# Patient Record
Sex: Female | Born: 1975 | Hispanic: Yes
Health system: Western US, Academic
[De-identification: ages and names within clinical notes are randomized; demographics above are authoritative.]

## PROBLEM LIST (undated history)

## (undated) DIAGNOSIS — I10 Essential (primary) hypertension: Secondary | ICD-10-CM

## (undated) DIAGNOSIS — E119 Type 2 diabetes mellitus without complications: Secondary | ICD-10-CM

## (undated) HISTORY — PX: OTHER SURGICAL HISTORY: SHX169

## (undated) HISTORY — PX: WISDOM TOOTH EXTRACTION: SHX21

## (undated) HISTORY — PX: CHOLECYSTECTOMY: SHX55

## (undated) MED ORDER — GLIMEPIRIDE 1 MG OR TABS
ORAL_TABLET | ORAL | 0 refills | Status: AC
Start: 2018-09-14 — End: ?

---

## 2002-12-28 HISTORY — PX: KNEE ARTHROSCOPY W/ DEBRIDEMENT: SHX1867

## 2013-06-07 ENCOUNTER — Encounter (HOSPITAL_BASED_OUTPATIENT_CLINIC_OR_DEPARTMENT_OTHER): Payer: No Typology Code available for payment source | Admitting: Surgery

## 2013-08-31 ENCOUNTER — Emergency Department (HOSPITAL_COMMUNITY): Payer: 59

## 2013-08-31 ENCOUNTER — Encounter (HOSPITAL_COMMUNITY): Payer: Self-pay | Admitting: Emergency Medicine

## 2013-08-31 ENCOUNTER — Emergency Department (HOSPITAL_COMMUNITY)
Admission: EM | Admit: 2013-08-31 | Discharge: 2013-08-31 | Disposition: A | Discharge status: Home / Self Care | Payer: 59 | Attending: Emergency Medicine | Admitting: Emergency Medicine

## 2013-08-31 DIAGNOSIS — Z880 Allergy status to penicillin: Secondary | ICD-10-CM | POA: Insufficient documentation

## 2013-08-31 DIAGNOSIS — I10 Essential (primary) hypertension: Secondary | ICD-10-CM | POA: Insufficient documentation

## 2013-08-31 DIAGNOSIS — R1110 Vomiting, unspecified: Secondary | ICD-10-CM

## 2013-08-31 DIAGNOSIS — N390 Urinary tract infection, site not specified: Secondary | ICD-10-CM

## 2013-08-31 DIAGNOSIS — R112 Nausea with vomiting, unspecified: Secondary | ICD-10-CM | POA: Insufficient documentation

## 2013-08-31 DIAGNOSIS — E119 Type 2 diabetes mellitus without complications: Secondary | ICD-10-CM | POA: Insufficient documentation

## 2013-08-31 DIAGNOSIS — Z9884 Bariatric surgery status: Secondary | ICD-10-CM | POA: Insufficient documentation

## 2013-08-31 DIAGNOSIS — Z79899 Other long term (current) drug therapy: Secondary | ICD-10-CM | POA: Insufficient documentation

## 2013-08-31 HISTORY — DX: Type 2 diabetes mellitus without complications: E11.9

## 2013-08-31 HISTORY — DX: Essential (primary) hypertension: I10

## 2013-08-31 LAB — URINALYSIS, ROUTINE W REFLEX MICROSCOPIC
Glucose, UA: NEGATIVE mg/dL
pH: 6 (ref 5.0–8.0)

## 2013-08-31 LAB — CBC WITH DIFFERENTIAL/PLATELET
Basophils Absolute: 0 10*3/uL (ref 0.0–0.1)
HCT: 38.2 % (ref 36.0–46.0)
Hemoglobin: 12.8 g/dL (ref 12.0–15.0)
Lymphocytes Relative: 20 % (ref 12–46)
Lymphs Abs: 1.8 10*3/uL (ref 0.7–4.0)
Monocytes Absolute: 0.5 10*3/uL (ref 0.1–1.0)
Monocytes Relative: 6 % (ref 3–12)
Neutro Abs: 6.4 10*3/uL (ref 1.7–7.7)
RBC: 4.56 MIL/uL (ref 3.87–5.11)
WBC: 8.8 10*3/uL (ref 4.0–10.5)

## 2013-08-31 LAB — HEPATIC FUNCTION PANEL
ALT: 14 U/L (ref 0–35)
Alkaline Phosphatase: 82 U/L (ref 39–117)
Indirect Bilirubin: 0.7 mg/dL (ref 0.3–0.9)
Total Protein: 7.6 g/dL (ref 6.0–8.3)

## 2013-08-31 LAB — BASIC METABOLIC PANEL
BUN: 6 mg/dL (ref 6–23)
CO2: 27 mEq/L (ref 19–32)
Chloride: 98 mEq/L (ref 96–112)
Creatinine, Ser: 0.57 mg/dL (ref 0.50–1.10)

## 2013-08-31 LAB — URINE MICROSCOPIC-ADD ON

## 2013-08-31 MED ORDER — CIPROFLOXACIN IN D5W 400 MG/200ML IV SOLN
400.0000 mg | Freq: Once | INTRAVENOUS | Status: AC
  Administered 2013-08-31: 400 mg via INTRAVENOUS
  Filled 2013-08-31: qty 200

## 2013-08-31 MED ORDER — PANTOPRAZOLE SODIUM 40 MG IV SOLR
40.0000 mg | Freq: Once | INTRAVENOUS | Status: AC
  Administered 2013-08-31: 40 mg via INTRAVENOUS
  Filled 2013-08-31: qty 40

## 2013-08-31 MED ORDER — SODIUM CHLORIDE 0.9 % IV BOLUS (SEPSIS)
1000.0000 mL | Freq: Once | INTRAVENOUS | Status: AC
  Administered 2013-08-31: 1000 mL via INTRAVENOUS

## 2013-08-31 MED ORDER — ONDANSETRON 4 MG PO TBDP: ORAL_TABLET | ORAL | Status: DC

## 2013-08-31 MED ORDER — ONDANSETRON HCL 4 MG/2ML IJ SOLN
4.0000 mg | Freq: Once | INTRAMUSCULAR | Status: AC
  Administered 2013-08-31: 4 mg via INTRAVENOUS
  Filled 2013-08-31: qty 2

## 2013-08-31 MED ORDER — CIPROFLOXACIN HCL 500 MG PO TABS: 500.0000 mg | ORAL_TABLET | Freq: Two times a day (BID) | ORAL | Status: DC

## 2013-08-31 NOTE — ED Notes (Signed)
Pt has been having n/v abd pain x 1 week now, lmp Aug 1st week ,

## 2013-08-31 NOTE — ED Provider Notes (Signed)
CSN: [id number]     Arrival date & time 08/31/13  1317 History   First MD Initiated Contact with Patient 08/31/13 1331     Chief Complaint  Patient presents with  . Nausea  . Emesis  . Abdominal Pain   (Consider location/radiation/quality/duration/timing/severity/associated sxs/prior Treatment) Patient is a 37 y.o. female presenting with vomiting and abdominal pain. The history is provided by the patient (the pt complains of vomiting for a couple days).  Emesis Severity:  Mild Timing:  Intermittent Quality:  Undigested food Able to tolerate:  Liquids Progression:  Unchanged Associated symptoms: no diarrhea and no headaches   Abdominal Pain Associated symptoms: vomiting   Associated symptoms: no chest pain, no cough, no diarrhea, no fatigue and no hematuria     Past Medical History  Diagnosis Date  . Diabetes mellitus without complication   . Hypertension    Past Surgical History  Procedure Laterality Date  . Lapband     No family history on file. History  Substance Use Topics  . Smoking status: Not on file  . Smokeless tobacco: Not on file  . Alcohol Use: Not on file   OB History   Grav Para Term Preterm Abortions TAB SAB Ect Mult Living                 Review of Systems  Constitutional: Negative for appetite change and fatigue.  HENT: Negative for congestion, sinus pressure and ear discharge.   Eyes: Negative for discharge.  Respiratory: Negative for cough.   Cardiovascular: Negative for chest pain.  Gastrointestinal: Positive for vomiting. Negative for diarrhea.  Genitourinary: Negative for frequency and hematuria.  Musculoskeletal: Negative for back pain.  Skin: Negative for rash.  Neurological: Negative for seizures and headaches.  Psychiatric/Behavioral: Negative for hallucinations.    Allergies  Aleve; Amoxicillin; Cephalosporins; Nsaids; and Penicillins  Home Medications   Current Outpatient Rx  Name  Route  Sig  Dispense  Refill  . acetaminophen  (TYLENOL) 500 MG tablet   Oral   Take 500 mg by mouth every 6 (six) hours as needed for pain.         Marland Kitchen albuterol (PROVENTIL HFA;VENTOLIN HFA) 108 (90 BASE) MCG/ACT inhaler   Inhalation   Inhale 2 puffs into the lungs every 6 (six) hours as needed for wheezing.         Marland Kitchen ALPRAZolam (XANAX) 1 MG tablet   Oral   Take 1 mg by mouth 3 (three) times daily as needed for sleep or anxiety.         . bismuth subsalicylate (PEPTO BISMOL) 262 MG/15ML suspension   Oral   Take 15 mLs by mouth every 6 (six) hours as needed for indigestion.         Marland Kitchen EPINEPHrine (EPIPEN) 0.3 mg/0.3 mL SOAJ injection   Intramuscular   Inject 0.3 mg into the muscle once.         Marland Kitchen glimepiride (AMARYL) 4 MG tablet   Oral   Take 8 mg by mouth daily before breakfast.         . HYDROcodone-acetaminophen (NORCO/VICODIN) 5-325 MG per tablet   Oral   Take 1 tablet by mouth every 6 (six) hours as needed for pain.         Marland Kitchen ondansetron (ZOFRAN) 4 MG tablet   Oral   Take 4 mg by mouth every 8 (eight) hours as needed for nausea.         Marland Kitchen oxyCODONE (OXYCONTIN) 10 MG 12 hr  tablet   Oral   Take 10 mg by mouth every 12 (twelve) hours.         . propranolol (INDERAL) 20 MG tablet   Oral   Take 20 mg by mouth 2 (two) times daily.         . ciprofloxacin (CIPRO) 500 MG tablet   Oral   Take 1 tablet (500 mg total) by mouth 2 (two) times daily. One po bid x 7 days   14 tablet   0   . ondansetron (ZOFRAN ODT) 4 MG disintegrating tablet      4mg  ODT q6 hours prn nausea/vomit   12 tablet   0    BP 141/81  Pulse 82  Temp(Src) 98 F (36.7 C) (Oral)  Resp 20  SpO2 100%  LMP 07/28/2013 Physical Exam  Constitutional: She is oriented to person, place, and time. She appears well-developed.  HENT:  Head: Normocephalic.  Eyes: Conjunctivae and EOM are normal. No scleral icterus.  Neck: Neck supple. No thyromegaly present.  Cardiovascular: Normal rate and regular rhythm.  Exam reveals no gallop  and no friction rub.   No murmur heard. Pulmonary/Chest: No stridor. She has no wheezes. She has no rales. She exhibits no tenderness.  Abdominal: She exhibits no distension. There is no tenderness. There is no rebound.  Musculoskeletal: Normal range of motion. She exhibits no edema.  Lymphadenopathy:    She has no cervical adenopathy.  Neurological: She is oriented to person, place, and time. Coordination normal.  Skin: No rash noted. No erythema.  Psychiatric: She has a normal mood and affect. Her behavior is normal.    ED Course  Procedures (including critical care time) Labs Review Labs Reviewed  URINALYSIS, ROUTINE W REFLEX MICROSCOPIC - Abnormal; Notable for the following:    APPearance CLOUDY (*)    Leukocytes, UA LARGE (*)    All other components within normal limits  BASIC METABOLIC PANEL - Abnormal; Notable for the following:    Sodium 134 (*)    Glucose, Bld 190 (*)    All other components within normal limits  HEPATIC FUNCTION PANEL - Abnormal; Notable for the following:    Albumin 3.3 (*)    All other components within normal limits  GLUCOSE, CAPILLARY - Abnormal; Notable for the following:    Glucose-Capillary 151 (*)    All other components within normal limits  URINE MICROSCOPIC-ADD ON - Abnormal; Notable for the following:    Bacteria, UA MANY (*)    All other components within normal limits  URINE CULTURE  CBC WITH DIFFERENTIAL   Imaging Review Dg Abd Acute W/chest  08/31/2013   *RADIOLOGY REPORT*  Clinical Data: Upper abdominal pain, nausea and vomiting  ACUTE ABDOMEN SERIES (ABDOMEN 2 VIEW & CHEST 1 VIEW)  Comparison: None.  Findings: Slightly low inspiratory volumes with left basilar atelectasis.  Mild prominence of the left heart is likely within normal limits given frontal technique and of low inspiratory volume.  No focal airspace consolidation, pulmonary edema, pleural effusion or pneumothorax.  No free air under the diaphragm.  Lap band with abnormal Phi  angle of 80 degrees.  The subcutaneous reservoir projects over the right abdomen.  The connecting tubing appears intact.  The bowel gas pattern is not obstructed.  Surgical clips in the right upper quadrant suggest prior cholecystectomy. No acute osseous abnormality.  IMPRESSION:  1.  Abnormal Phi angle of 80 degrees suggests slipped lap band. Correlation with prior imaging if available would be helpful.  Consider surgical consultation if clinically warranted. 2.  The bowel gas pattern is not obstructed. 3.  Slightly low inspiratory volumes with left basilar atelectasis.   Original Report Authenticated By: Malachy Moan, M.D.    MDM  The pt has a uti and will follow up as out pt.  She states she knows her lap band may need revision and she has spoke to her surgeon about this and has decided not to do the revision now. 1. UTI (lower urinary tract infection)   2. Vomiting        Benny Lennert, MD 08/31/13 [id number]

## 2013-08-31 NOTE — Progress Notes (Signed)
Pt noted without pcp States she is visiting from [place] and she is seen by Dr Micah Flesher EPIC updated

## 2013-09-01 LAB — URINE CULTURE

## 2014-09-09 IMAGING — CR DG ABDOMEN ACUTE W/ 1V CHEST
3 series · 3 of 3 positions shown · non-contrast
Comparison: None.

CLINICAL DATA: Upper abdominal pain, nausea and vomiting

ACUTE ABDOMEN SERIES (ABDOMEN 2 VIEW & CHEST 1 VIEW)

[w chest pa]
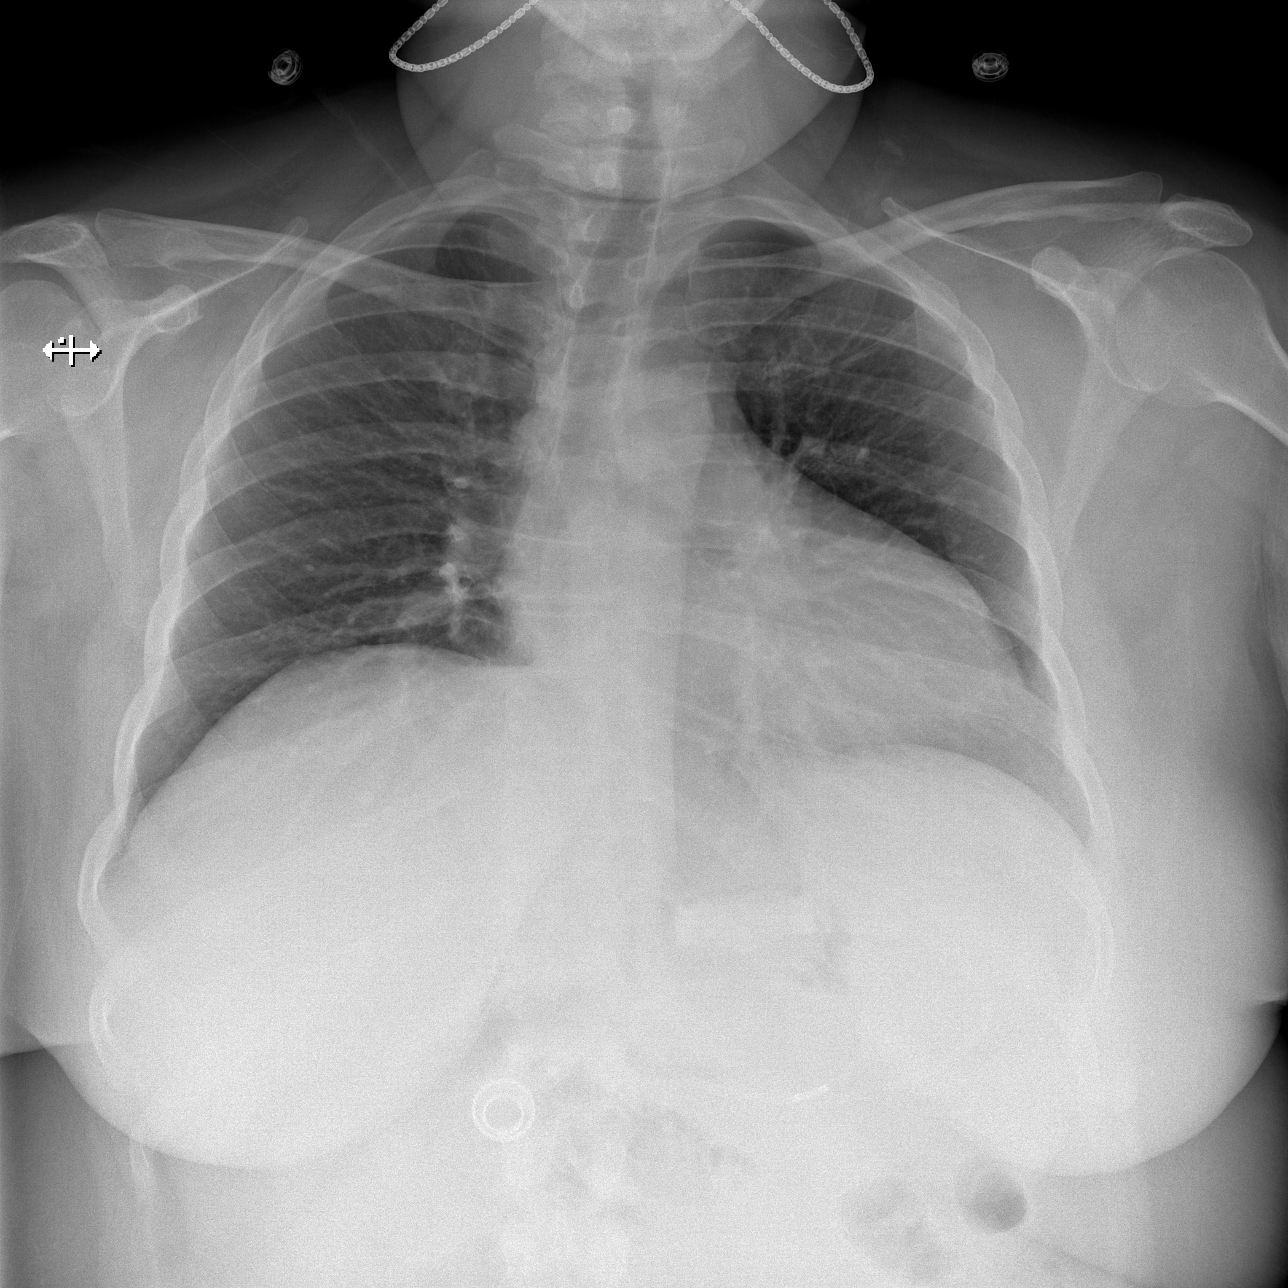

[w abdomen upright]
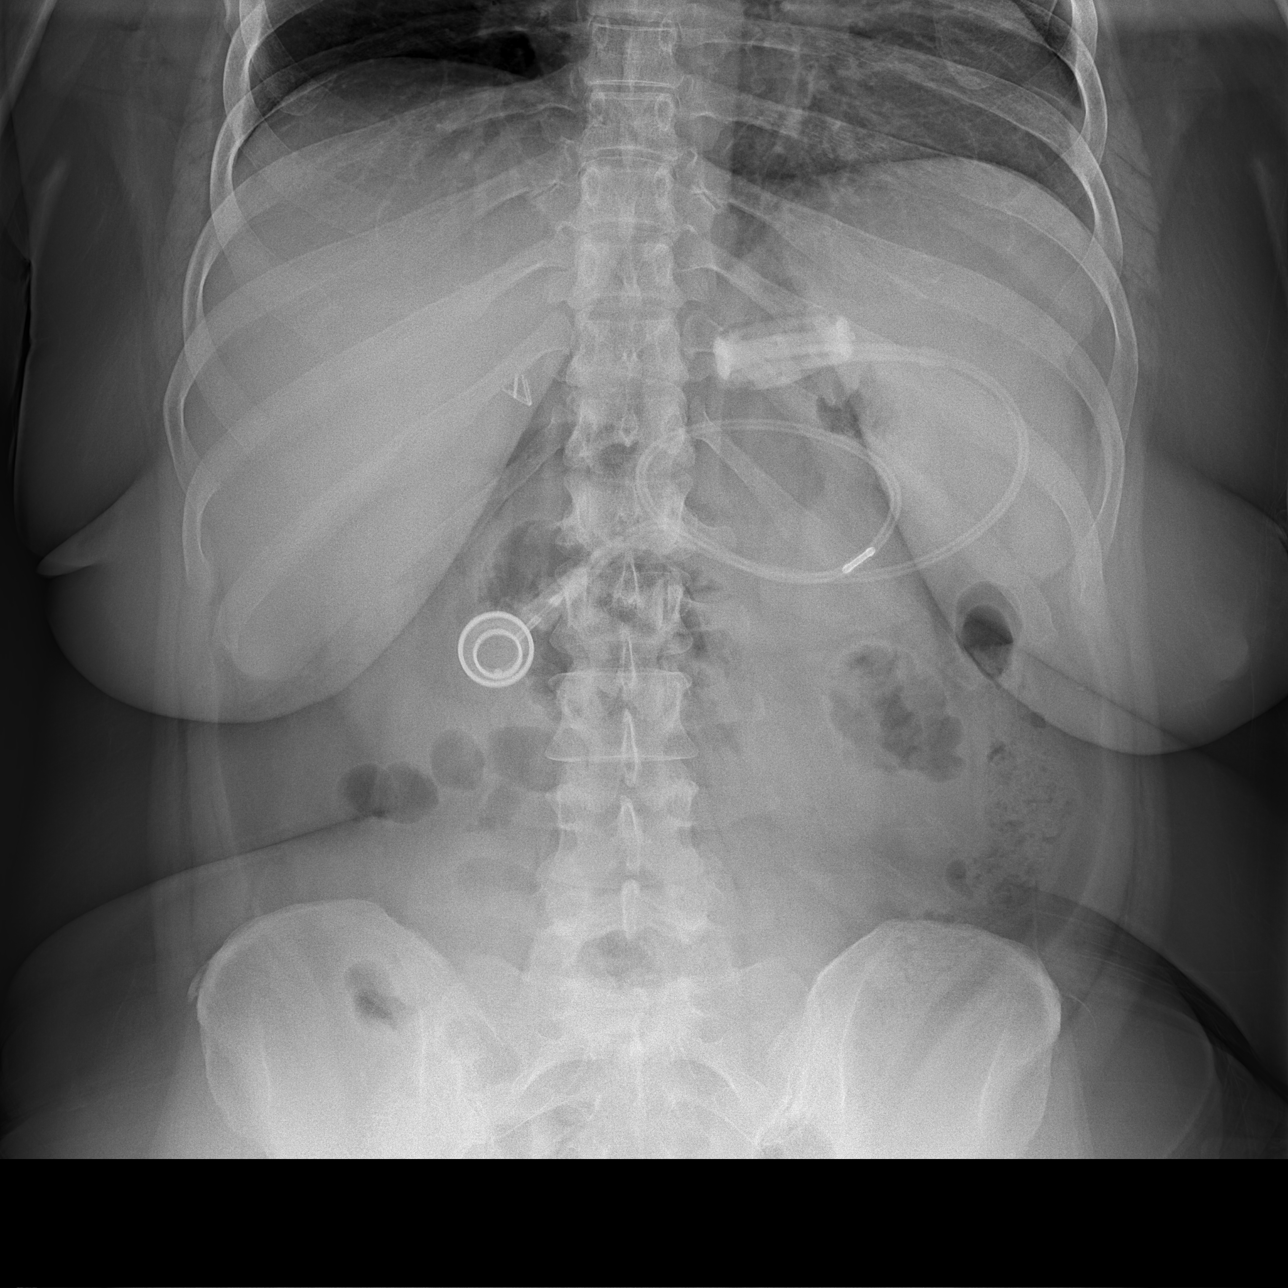

[t abdomen supine]
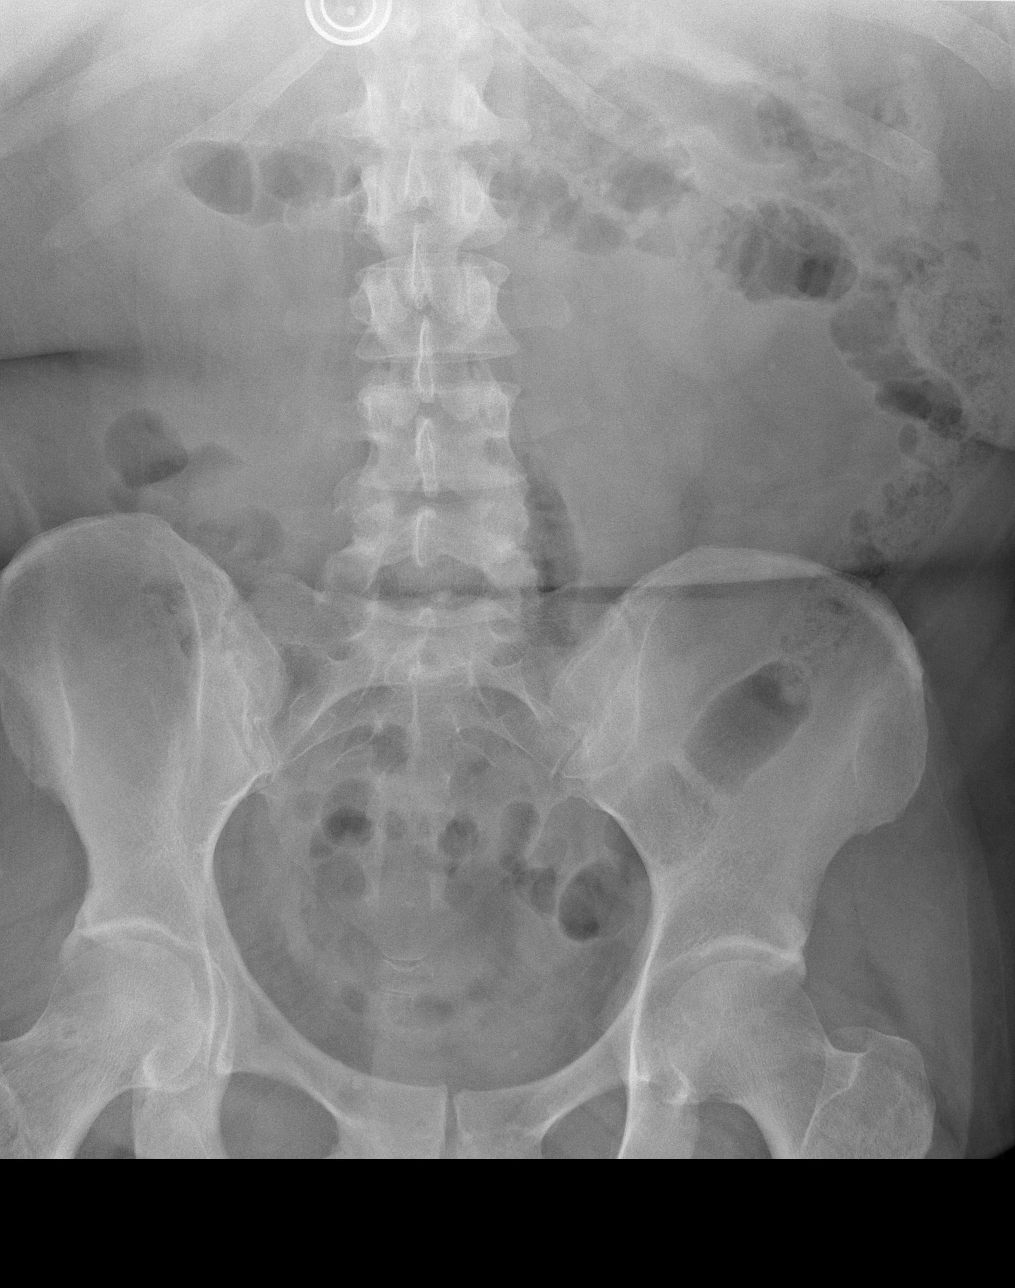

[3 of 3 positions shown; findings below may reference images not displayed]

FINDINGS: Slightly low inspiratory volumes with left basilar
atelectasis.  Mild prominence of the left heart is likely within
normal limits given frontal technique and of low inspiratory
volume.  No focal airspace consolidation, pulmonary edema, pleural
effusion or pneumothorax.  No free air under the diaphragm.

Lap band with abnormal Phi angle of 80 degrees.  The subcutaneous
reservoir projects over the right abdomen.  The connecting tubing
appears intact.  The bowel gas pattern is not obstructed.  Surgical
clips in the right upper quadrant suggest prior cholecystectomy.
No acute osseous abnormality.
IMPRESSION: 1.  Abnormal Phi angle of 80 degrees suggests slipped lap band.
Correlation with prior imaging if available would be helpful.
Consider surgical consultation if clinically warranted.
2.  The bowel gas pattern is not obstructed.
3.  Slightly low inspiratory volumes with left basilar atelectasis.

## 2016-04-23 ENCOUNTER — Emergency Department (HOSPITAL_COMMUNITY)
Admission: EM | Admit: 2016-04-23 | Discharge: 2016-04-23 | Disposition: A | Discharge status: Home / Self Care | Payer: Medicaid - Out of State | Attending: Emergency Medicine | Admitting: Emergency Medicine

## 2016-04-23 ENCOUNTER — Encounter (HOSPITAL_COMMUNITY): Payer: Self-pay

## 2016-04-23 DIAGNOSIS — Z760 Encounter for issue of repeat prescription: Secondary | ICD-10-CM

## 2016-04-23 DIAGNOSIS — Y9289 Other specified places as the place of occurrence of the external cause: Secondary | ICD-10-CM | POA: Insufficient documentation

## 2016-04-23 DIAGNOSIS — E119 Type 2 diabetes mellitus without complications: Secondary | ICD-10-CM | POA: Insufficient documentation

## 2016-04-23 DIAGNOSIS — E669 Obesity, unspecified: Secondary | ICD-10-CM | POA: Insufficient documentation

## 2016-04-23 DIAGNOSIS — Y9389 Activity, other specified: Secondary | ICD-10-CM | POA: Insufficient documentation

## 2016-04-23 DIAGNOSIS — Z880 Allergy status to penicillin: Secondary | ICD-10-CM | POA: Insufficient documentation

## 2016-04-23 DIAGNOSIS — Z79899 Other long term (current) drug therapy: Secondary | ICD-10-CM | POA: Insufficient documentation

## 2016-04-23 DIAGNOSIS — I10 Essential (primary) hypertension: Secondary | ICD-10-CM | POA: Insufficient documentation

## 2016-04-23 DIAGNOSIS — H6123 Impacted cerumen, bilateral: Secondary | ICD-10-CM | POA: Insufficient documentation

## 2016-04-23 DIAGNOSIS — Y998 Other external cause status: Secondary | ICD-10-CM | POA: Insufficient documentation

## 2016-04-23 DIAGNOSIS — T7840XA Allergy, unspecified, initial encounter: Secondary | ICD-10-CM

## 2016-04-23 DIAGNOSIS — X58XXXA Exposure to other specified factors, initial encounter: Secondary | ICD-10-CM | POA: Insufficient documentation

## 2016-04-23 MED ORDER — FLUTICASONE PROPIONATE 50 MCG/ACT NA SUSP: 2.0000 | Freq: Every day | NASAL | Status: AC

## 2016-04-23 MED ORDER — GLIMEPIRIDE 4 MG PO TABS: 8.0000 mg | ORAL_TABLET | Freq: Every day | ORAL | Status: AC

## 2016-04-23 NOTE — ED Notes (Signed)
PA at bedside.

## 2016-04-23 NOTE — ED Provider Notes (Signed)
CSN: [id number]     Arrival date & time 04/23/16  0551 History   First MD Initiated Contact with Patient 04/23/16 0600     No chief complaint on file.    (Consider location/radiation/quality/duration/timing/severity/associated sxs/prior Treatment) HPI   40 year old female with history of non-insulin-dependent dependent diabetes, hypertension presenting with multiple complaints. First complaint is that patient is running out of her normal diabetic medications specifically McBride. She takes 8 mg daily for the past month she has been taking half of those doses to try to preserve medication. She felt that her diabetes is not being well controlled due to not being compliant with her medication. She endorse some aspects of polyuria and polydipsia and generalized fatigue. Furthermore, 3 weeks ago patient developed URI symptoms including congestion and sneezing coughing and postnasal drip and not feeling well. She was seen at an urgent care and was prescribed Z-Pak which she finished the course but it did not provide any significant relief. She still endorses sinus congestion and occasional cough, occasional abdominal discomfort with several bouts of vomiting that is nonbloody and nonbilious and several loose stools. She also endorse mild generalized body aches.  Yesterday she ate some eggs and feel that she may have developed an allergic reaction to it which includes abdominal discomfort and and diarrhea. She has been taking the counter medications include benadryl to help with symptom with adequate relief. [place] in [place] and she is currently here for work. She is missing work due to not feeling well and requesting for work note. Otherwise patient denies having any significant fever, neck stiffness, shortness of breath, back pain, dysuria, hematuria, vaginal bleeding or vaginal discharge.  Past Medical History  Diagnosis Date  . Diabetes mellitus without complication   .  Hypertension    Past Surgical History  Procedure Laterality Date  . Lapband     No family history on file. Social History  Substance Use Topics  . Smoking status: Not on file  . Smokeless tobacco: Not on file  . Alcohol Use: Not on file   OB History    No data available     Review of Systems  All other systems reviewed and are negative.     Allergies  Aleve; Amoxicillin; Cephalosporins; Nsaids; and Penicillins  Home Medications   Prior to Admission medications   Medication Sig Start Date End Date Taking? Authorizing Provider  acetaminophen (TYLENOL) 500 MG tablet Take 500 mg by mouth every 6 (six) hours as needed for pain.    Historical Provider, MD  albuterol (PROVENTIL HFA;VENTOLIN HFA) 108 (90 BASE) MCG/ACT inhaler Inhale 2 puffs into the lungs every 6 (six) hours as needed for wheezing.    Historical Provider, MD  ALPRAZolam Prudy Feeler(XANAX) 1 MG tablet Take 1 mg by mouth 3 (three) times daily as needed for sleep or anxiety.    Historical Provider, MD  bismuth subsalicylate (PEPTO BISMOL) 262 MG/15ML suspension Take 15 mLs by mouth every 6 (six) hours as needed for indigestion.    Historical Provider, MD  ciprofloxacin (CIPRO) 500 MG tablet Take 1 tablet (500 mg total) by mouth 2 (two) times daily. One po bid x 7 days 08/31/13   Bethann BerkshireJoseph Zammit, MD  EPINEPHrine (EPIPEN) 0.3 mg/0.3 mL SOAJ injection Inject 0.3 mg into the muscle once.    Historical Provider, MD  glimepiride (AMARYL) 4 MG tablet Take 8 mg by mouth daily before breakfast.    Historical Provider, MD  HYDROcodone-acetaminophen (NORCO/VICODIN) 5-325 MG per tablet Take  1 tablet by mouth every 6 (six) hours as needed for pain.    Historical Provider, MD  ondansetron (ZOFRAN ODT) 4 MG disintegrating tablet  ODT q6 hours prn nausea/vomit 08/31/13   Bethann Berkshire, MD  ondansetron (ZOFRAN) 4 MG tablet Take 4 mg by mouth every 8 (eight) hours as needed for nausea.    Historical Provider, MD  oxyCODONE (OXYCONTIN) 10 MG 12 hr  tablet Take 10 mg by mouth every 12 (twelve) hours.    Historical Provider, MD  propranolol (INDERAL) 20 MG tablet Take 20 mg by mouth 2 (two) times daily.    Historical Provider, MD   BP 139/96 mmHg  Pulse 108  Temp(Src) 98 F (36.7 C) (Oral)  Resp 22  Ht  (1.6 m)  SpO2 98% Physical Exam  Constitutional: She is oriented to person, place, and time. She appears well-developed and well-nourished. No distress.  Obese Caucasian female sitting in bed in no acute discomfort and nontoxic.  HENT:  Head: Atraumatic.  Ears: Cerumen impaction to both ears obstructing the TMs Nose: Mild boggy nasal turbinates Throat: Uvula is midline no tonsillar enlargement or exudates, no trismus  Eyes: Conjunctivae are normal.  Neck: Neck supple.  No nuchal rigidity  Cardiovascular: Normal rate and regular rhythm.   Pulmonary/Chest: Effort normal and breath sounds normal. No respiratory distress. She has no wheezes. She has no rales. She exhibits no tenderness.  Abdominal: Soft. There is no tenderness.  Neurological: She is alert and oriented to person, place, and time.  Skin: No rash noted.  Psychiatric: She has a normal mood and affect.  Nursing note and vitals reviewed.   ED Course  Procedures (including critical care time)   MDM   Final diagnoses:  Encounter for medication refill  Allergy, initial encounter    BP 139/96 mmHg  Pulse 108  Temp(Src) 98 F (36.7 C) (Oral)  Resp 22  Ht  (1.6 m)  SpO2 98%  LMP 04/01/2016 (Approximate)   6:26 AM Patient presents with congestion, and some allergy symptoms since moving down to [place] for work from [place].  On exam I don't appreciate any significant signs of bacterial infection. The lungs are clear and she has no shortness of breath without any fever. Plan to prescribe decongestions, and Flonase for symptomatic relief. She is also out of her diabetic medication which I'm happy to refill. Work note provided. Otherwise no acute emergent  medical condition concerning which may require further testing currently. Return precaution discussed.    Fayrene Helper, PA-C 04/23/16 0636  Layla Maw Ward, DO 04/23/16 1610

## 2016-04-23 NOTE — ED Notes (Signed)
Per pt, complaining of headache, coughing, congestion. Pt states that this has been going since Sunday. Pt is out of glimipride, diabetic medication. She has been out "for a couple of months".

## 2016-04-23 NOTE — Discharge Instructions (Signed)
Please take your diabetic medication as prescribed.  Use flonase to help with congestion and sinus symptoms.  Follow up with your doctor for further care.   Medicine Refill at the Emergency Department We have refilled your medicine today, but it is best for you to get refills through your primary health care provider's office. In the future, please plan ahead so you do not need to get refills from the emergency department. If the medicine we refilled was a maintenance medicine, you may have received only enough to get you by until you are able to see your regular health care provider.   This information is not intended to replace advice given to you by your health care provider. Make sure you discuss any questions you have with your health care provider.   Document Released: 04/01/2004 Document Revised: 01/04/2015 Document Reviewed: 03/23/2014 Elsevier Interactive Patient Education 2016 [employer]  Allergies An allergy is when your body reacts to a substance in a way that is not normal. An allergic reaction can happen after you:  Eat something.  Breathe in something.  Touch something. WHAT KINDS OF ALLERGIES ARE THERE? You can be allergic to:  Things that are only around during certain seasons, like molds and pollens.  Foods.  Drugs.  Insects.  Animal dander. WHAT ARE SYMPTOMS OF ALLERGIES?  Puffiness (swelling). This may happen on the lips, face, tongue, mouth, or throat.  Sneezing.  Coughing.  Breathing loudly (wheezing).  Stuffy nose.  Tingling in the mouth.  A rash.  Itching.  Itchy, red, puffy areas of skin (hives).  Watery eyes.  Throwing up (vomiting).  Watery poop (diarrhea).  Dizziness.  Feeling faint or fainting.  Trouble breathing or swallowing.  A tight feeling in the chest.  A fast heartbeat. HOW ARE ALLERGIES DIAGNOSED? Allergies can be diagnosed with:  A medical and family history.  Skin tests.  Blood tests.  A food diary.  A food diary is a record of all the foods, drinks, and symptoms you have each day.  The results of an elimination diet. This diet involves making sure not to eat certain foods and then seeing what happens when you start eating them again. HOW ARE ALLERGIES TREATED? There is no cure for allergies, but allergic reactions can be treated with medicine. Severe reactions usually need to be treated at a hospital.  HOW CAN REACTIONS BE PREVENTED? The best way to prevent an allergic reaction is to avoid the thing you are allergic to. Allergy shots and medicines can also help prevent reactions in some cases.   This information is not intended to replace advice given to you by your health care provider. Make sure you discuss any questions you have with your health care provider.   Document Released: 04/10/2013 Document Revised: 01/04/2015 Document Reviewed: 09/25/2014 Elsevier Interactive Patient Education [employer]2016 Elsevier Inc.

## 2016-05-06 ENCOUNTER — Encounter (HOSPITAL_COMMUNITY): Payer: Self-pay | Admitting: *Deleted

## 2016-05-06 ENCOUNTER — Emergency Department (HOSPITAL_COMMUNITY)
Admission: EM | Admit: 2016-05-06 | Discharge: 2016-05-06 | Disposition: A | Discharge status: Home / Self Care | Payer: Medicaid - Out of State | Attending: Emergency Medicine | Admitting: Emergency Medicine

## 2016-05-06 DIAGNOSIS — Z79899 Other long term (current) drug therapy: Secondary | ICD-10-CM | POA: Insufficient documentation

## 2016-05-06 DIAGNOSIS — Z880 Allergy status to penicillin: Secondary | ICD-10-CM | POA: Insufficient documentation

## 2016-05-06 DIAGNOSIS — I10 Essential (primary) hypertension: Secondary | ICD-10-CM | POA: Insufficient documentation

## 2016-05-06 DIAGNOSIS — R739 Hyperglycemia, unspecified: Secondary | ICD-10-CM

## 2016-05-06 DIAGNOSIS — R51 Headache: Secondary | ICD-10-CM | POA: Insufficient documentation

## 2016-05-06 DIAGNOSIS — Z7951 Long term (current) use of inhaled steroids: Secondary | ICD-10-CM | POA: Insufficient documentation

## 2016-05-06 DIAGNOSIS — K591 Functional diarrhea: Secondary | ICD-10-CM

## 2016-05-06 DIAGNOSIS — Z7984 Long term (current) use of oral hypoglycemic drugs: Secondary | ICD-10-CM | POA: Insufficient documentation

## 2016-05-06 DIAGNOSIS — E1165 Type 2 diabetes mellitus with hyperglycemia: Secondary | ICD-10-CM | POA: Insufficient documentation

## 2016-05-06 DIAGNOSIS — Z3202 Encounter for pregnancy test, result negative: Secondary | ICD-10-CM | POA: Insufficient documentation

## 2016-05-06 DIAGNOSIS — Z87891 Personal history of nicotine dependence: Secondary | ICD-10-CM | POA: Insufficient documentation

## 2016-05-06 LAB — I-STAT CHEM 8, ED
BUN: 9 mg/dL (ref 6–20)
CALCIUM ION: 1.06 mmol/L — AB (ref 1.12–1.23)
CREATININE: 0.6 mg/dL (ref 0.44–1.00)
Chloride: 98 mmol/L — ABNORMAL LOW (ref 101–111)
Glucose, Bld: 342 mg/dL — ABNORMAL HIGH (ref 65–99)
HEMATOCRIT: 46 % (ref 36.0–46.0)
HEMOGLOBIN: 15.6 g/dL — AB (ref 12.0–15.0)
Potassium: 3.9 mmol/L (ref 3.5–5.1)
Sodium: 135 mmol/L (ref 135–145)
TCO2: 23 mmol/L (ref 0–100)

## 2016-05-06 LAB — URINALYSIS, ROUTINE W REFLEX MICROSCOPIC
BILIRUBIN URINE: NEGATIVE
Hgb urine dipstick: NEGATIVE
KETONES UR: 40 mg/dL — AB
LEUKOCYTES UA: NEGATIVE
NITRITE: NEGATIVE
PROTEIN: NEGATIVE mg/dL
Specific Gravity, Urine: >1.046 — ABNORMAL HIGH (ref 1.005–1.030)
pH: 5.5 (ref 5.0–8.0)

## 2016-05-06 LAB — URINE MICROSCOPIC-ADD ON: RBC / HPF: NONE SEEN RBC/hpf (ref 0–5)

## 2016-05-06 LAB — POC URINE PREG, ED: Preg Test, Ur: NEGATIVE

## 2016-05-06 LAB — CBG MONITORING, ED: GLUCOSE-CAPILLARY: 359 mg/dL — AB (ref 65–99)

## 2016-05-06 MED ORDER — ONDANSETRON 8 MG PO TBDP: ORAL_TABLET | ORAL | Status: AC

## 2016-05-06 MED ORDER — ONDANSETRON 4 MG PO TBDP
8.0000 mg | ORAL_TABLET | Freq: Once | ORAL | Status: AC
  Administered 2016-05-06: 8 mg via ORAL
  Filled 2016-05-06: qty 2

## 2016-05-06 NOTE — Discharge Instructions (Signed)

## 2016-05-06 NOTE — ED Provider Notes (Signed)
CSN: [id number]     Arrival date & time 05/06/16  0602 History   First MD Initiated Contact with Patient 05/06/16 [phone]     Chief Complaint  Patient presents with  . Emesis    Patient is a 40 y.o. female presenting with diarrhea. The history is provided by the patient.  Diarrhea Quality:  Watery Severity:  Moderate Onset quality:  Gradual Number of episodes:  Multiple Duration:  24 hours Timing:  Intermittent Progression:  Worsening Relieved by:  Anti-motility medications Worsened by:  Nothing tried Associated symptoms: abdominal pain and headaches   Associated symptoms: no chills, no recent cough, no fever and no vomiting   Risk factors: recent antibiotic use   Risk factors: no travel to endemic areas   Risk factors comment:  Zpack last month PT reports nausea with "spitting up" but denies vomiting and also multiple episodes of nonbloody diarrhea for past 24 hours She also reports abd pain - she points to LUQ as source of pain No fever She also reports intermittent HA for past several months since moving to the [facility] She reports "itching" but now just found out she has bed bugs No vag bleeding/discharge No dysuria She is s/p lap band from 2008   Past Medical History  Diagnosis Date  . Diabetes mellitus without complication (HCC)   . Hypertension    Past Surgical History  Procedure Laterality Date  . Lapband    . Cholecystectomy    . Knee arthroscopy w/ debridement Left 2004  . Wisdom tooth extraction Bilateral     1993   No family history on file. Social History  Substance Use Topics  . Smoking status: Former Smoker    Types: Cigarettes    Quit date: 03/29/2007  . Smokeless tobacco: Never Used  . Alcohol Use: Yes     Comment: occassionally   OB History    No data available     Review of Systems  Constitutional: Negative for fever and chills.  Gastrointestinal: Positive for abdominal pain and diarrhea. Negative for vomiting and blood in stool.   Genitourinary: Negative for dysuria, vaginal bleeding and vaginal discharge.  Neurological: Positive for headaches.  All other systems reviewed and are negative.     Allergies  Aleve; Amoxicillin; Cephalosporins; Nsaids; and Penicillins  Home Medications   Prior to Admission medications   Medication Sig Start Date End Date Taking? Authorizing Provider  acetaminophen (TYLENOL) 500 MG tablet Take 500 mg by mouth every 6 (six) hours as needed for pain.    Historical Provider, MD  albuterol (PROVENTIL HFA;VENTOLIN HFA) 108 (90 BASE) MCG/ACT inhaler Inhale 2 puffs into the lungs every 6 (six) hours as needed for wheezing.    Historical Provider, MD  ALPRAZolam Prudy Feeler(XANAX) 1 MG tablet Take 1 mg by mouth 3 (three) times daily as needed for sleep or anxiety.    Historical Provider, MD  bismuth subsalicylate (PEPTO BISMOL) 262 MG/15ML suspension Take 15 mLs by mouth every 6 (six) hours as needed for indigestion.    Historical Provider, MD  EPINEPHrine (EPIPEN) 0.3 mg/0.3 mL SOAJ injection Inject 0.3 mg into the muscle once.    Historical Provider, MD  fluticasone (FLONASE) 50 MCG/ACT nasal spray Place 2 sprays into both nostrils daily. 04/23/16   Fayrene HelperBowie Tran, PA-C  glimepiride (AMARYL) 4 MG tablet Take 2 tablets (8 mg total) by mouth daily before breakfast. 04/23/16   Fayrene HelperBowie Tran, PA-C  ondansetron (ZOFRAN) 4 MG tablet Take 4 mg by mouth every 8 (eight) hours  as needed for nausea.    Historical Provider, MD  propranolol (INDERAL) 20 MG tablet Take 20 mg by mouth 2 (two) times daily.    Historical Provider, MD   BP 132/102 mmHg  Pulse 98  Temp(Src) 98.3 F (36.8 C) (Oral)  Resp 20  Ht  (1.626 m)  Wt 99.791 kg  BMI 37.74 kg/m2  SpO2 100%  LMP 04/01/2016 (Approximate) Physical Exam CONSTITUTIONAL: Well developed/well nourished HEAD: Normocephalic/atraumatic EYES: EOMI/PERRL ENMT: Mucous membranes moist NECK: supple no meningeal signs SPINE/BACK:entire spine nontender CV: S1/S2 noted, no  murmurs/rubs/gallops noted LUNGS: Lungs are clear to auscultation bilaterally, no apparent distress ABDOMEN: soft, nontender, no rebound or guarding, bowel sounds noted throughout abdomen GU:no cva tenderness NEURO: Pt is awake/alert/appropriate, moves all extremitiesx4.  No facial droop.   EXTREMITIES: pulses normal/equal, full ROM SKIN: warm, color normal PSYCH: no abnormalities of mood noted, alert and oriented to situation  ED Course  Procedures  Medications  ondansetron (ZOFRAN-ODT) disintegrating tablet 8 mg (8 mg Oral Given 05/06/16 0745)    Labs Review Labs Reviewed  URINALYSIS, ROUTINE W REFLEX MICROSCOPIC (NOT AT [facility]) - Abnormal; Notable for the following:    Specific Gravity, Urine >1.046 (*)    Glucose, UA >1000 (*)    Ketones, ur 40 (*)    All other components within normal limits  URINE MICROSCOPIC-ADD ON - Abnormal; Notable for the following:    Squamous Epithelial / LPF 0-5 (*)    Bacteria, UA RARE (*)    All other components within normal limits  CBG MONITORING, ED - Abnormal; Notable for the following:    Glucose-Capillary 359 (*)    All other components within normal limits  I-STAT CHEM 8, ED - Abnormal; Notable for the following:    Chloride 98 (*)    Glucose, Bld 342 (*)    Calcium, Ion 1.06 (*)    Hemoglobin 15.6 (*)    All other components within normal limits  POC URINE PREG, ED   I have personally reviewed and evaluated these images and lab results as part of my medical decision-making.  7:27 AM Pt well appearing No focal abd tenderness (she pointed to LUQ as source of pain, no lower pain reported) She appears well hydrated This does not appear c/w lap band complication.  She reports she has had no recent issues but reports all of her imaging in past reports lap band is misplaced but she has seen her surgeon for this and no issues were identified.   Pt well appearing No focal abd tenderness No vomiting She is hyperglycemia but bicarb  normal She admits to not taking full dose of diabetic meds Referred to outpatient f/u At this time does not appear to have acute abdominal emergency or lap band complication    MDM   Final diagnoses:  Functional diarrhea  Hyperglycemia    Nursing notes including past medical history and social history reviewed and considered in documentation Labs/vital reviewed myself and considered during evaluation     Zadie Rhine, MD 05/06/16 [phone]

## 2016-05-06 NOTE — ED Notes (Signed)
Pt c/o nausea, vomiting and diarrhea x 1 day. Pt states she took imodium last night and now she is having LLQ painPt also has active bed bugs. Bed bug in plastic bag.

## 2018-08-09 ENCOUNTER — Other Ambulatory Visit (INDEPENDENT_AMBULATORY_CARE_PROVIDER_SITE_OTHER): Payer: Self-pay

## 2018-08-09 ENCOUNTER — Telehealth (INDEPENDENT_AMBULATORY_CARE_PROVIDER_SITE_OTHER): Payer: Self-pay | Admitting: Family Medicine

## 2018-08-09 ENCOUNTER — Ambulatory Visit (INDEPENDENT_AMBULATORY_CARE_PROVIDER_SITE_OTHER): Payer: BLUE CROSS/BLUE SHIELD | Admitting: Family Medicine

## 2018-08-09 VITALS — BP 122/80 | HR 79 | Temp 97.3°F | Resp 15 | Wt 209.0 lb

## 2018-08-09 DIAGNOSIS — E119 Type 2 diabetes mellitus without complications: Secondary | ICD-10-CM

## 2018-08-09 LAB — CBC (HEMOGRAM)
Hematocrit: 41 % (ref 36–45)
Hemoglobin: 13.5 g/dL (ref 11.5–15.5)
MCH: 28.8 pg (ref 27.3–33.6)
MCHC: 33.3 g/dL (ref 32.2–36.5)
MCV: 86 fL (ref 81–98)
Platelet Count: 266 10*3/uL (ref 150–400)
RBC: 4.69 10*6/uL (ref 3.80–5.00)
RDW-CV: 12.3 % (ref 11.6–14.4)
WBC: 8.42 10*3/uL (ref 4.3–10.0)

## 2018-08-09 LAB — PR A1C RAPID, ONSITE: Hemoglobin A1C: 12.3 % — ABNORMAL HIGH (ref 4.0–6.0)

## 2018-08-09 MED ORDER — GLIMEPIRIDE 4 MG OR TABS
4.0000 mg | ORAL_TABLET | Freq: Two times a day (BID) | ORAL | 3 refills | Status: DC
Start: 2018-08-09 — End: 2018-10-31

## 2018-08-09 NOTE — Progress Notes (Signed)
Pt roomed, medications, and allergies verified and updated by Angelita InglesShannon Barclay Lennox, MA-C  Angelita Ingleshomas, Romano Stigger, CMA, 08/09/2018 3:26 PM

## 2018-08-09 NOTE — Progress Notes (Addendum)
Jake MichaelisMaria Elena-Ayla Gaughran is a 42 year old female who presents to the [facility] with a Medication Management (pt needs rx refill of her glimeperide has appt in approx 311 wk)  42 year old female who is noted the area.  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  She just left the [place] area and did not get refills of her medication prior to leaving.  The medicine she currently takes is glimepiride 4 mg twice daily.  She's not had a check of her diabetes for a couple of years and is scheduled to be seen here next week by the new physician's assistant.  She states she's never had a low blood sugar and does not check her sugars.  She has not had blood work for several years.  Denies any shortness of breath or chest pain.  Denies any numbness in her fingers or toes.  She states the blurry vision is been related to her sugars and she therefore has not tried to get a prescription for glasses yet at the advice of her previous doctors.    No past medical history on file.    Outpatient Medications Marked as Taking for the 08/09/18 encounter (Office Visit) with Bernie CoveySthay, Ronda Rajkumar A, MD   Medication Sig Dispense Refill    Glimepiride 4 MG Oral Tab Take 4 mg by mouth 2 times a day.         Review of patient's allergies indicates:  Allergies   Allergen Reactions    Cephalexin Anaphylaxis, Hives and Rash    Flu Virus Vaccine Anaphylaxis    Penicillins Anaphylaxis, Hives and Rash    Eggs Or Egg-Derived Products Nausea/Vomiting     Other reaction(s): Angioedema    Gabapentin      Extreme sleepiness, unable to awaken for 36 hours. Same with other "nerve" drugs.    Nsaids      Unable to take PO due to lap band.    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        No past surgical history on file.    Social History     Tobacco Use     Smoking status: Not on file   Substance Use Topics    Alcohol use: Not on file    Drug use: Not on file       Parts of this medical record are completed using a dragon dictation system.    Review of Systems   Eyes: Positive for visual disturbance.   Respiratory: Negative for chest tightness and shortness of breath.    Neurological: Negative for numbness.       BP 122/80    Pulse 79    Temp 97.3 F (36.3 C) (Temporal)    Resp 15    Wt 209 lb (94.8 kg)    SpO2 98%   Physical Exam   Constitutional: She is oriented to person, place, and time. She appears well-developed and well-nourished.   HENT:   Head: Normocephalic and atraumatic.   Pulmonary/Chest: Effort normal. No respiratory distress.   Neurological: She is alert and oriented to person, place, and time.   Skin: Skin is warm and dry.   Psychiatric: She has a normal mood and affect. Her behavior is normal.   Nursing note and vitals reviewed.    (  E11.9) Type 2 diabetes mellitus without complication, without long-term current use of insulin (HCC)  (primary encounter diagnosis)  Plan: Glimepiride 4 MG Oral Tab, CBC, Thyroid         Stimulating Hormone, A1C RAPID, ONSITE, Basic         Metabolic Panel          Patient Instructions   It was a pleasure to see you in clinic today. Your Medical Assistant was: Orpah ClintonShannon                You can schedule an appointment to see us by calling [phone] or via eCare.     If labs were ordered today the results are expected to be available via eCare 5 days later. Otherwise, result letters are mailed 7-10 days after your tests are completed. If your physician needs to change your care based on your results, you will receive a phone call to notify you. If you haven't heard from him/her and it has been more than 10 days please give us a call.     [facility] choosing [facility].     Have your blood drawn prior to leaving to test for kidney, thyroid and diabetic control.    Continue the medication and follow  up as planned.

## 2018-08-09 NOTE — Telephone Encounter (Signed)
Notify the patient that her hemoglobin A1c is 12.3 which is exceedingly high.  Make sure that she follows up with her scheduled appointment next week as planned.  The remainder of blood work should be in tomorrow

## 2018-08-09 NOTE — Patient Instructions (Addendum)
It was a pleasure to see you in clinic today. Your Medical Assistant was: Orpah ClintonShannon                You can schedule an appointment to see us by calling [phone] or via eCare.     If labs were ordered today the results are expected to be available via eCare 5 days later. Otherwise, result letters are mailed 7-10 days after your tests are completed. If your physician needs to change your care based on your results, you will receive a phone call to notify you. If you haven't heard from him/her and it has been more than 10 days please give us a call.     Thank you for choosing [facility].     Have your blood drawn prior to leaving to test for kidney, thyroid and diabetic control.    Continue the medication and follow up as planned.

## 2018-08-09 NOTE — Telephone Encounter (Signed)
(  TEXTING IS AN OPTION FOR [facility] ONLY)  Is this a [facility] clinic? Yes. What is the mobile number we can use to get a hold of you via text? [phone]      RETURN CALL: Detailed message on voicemail only      SUBJECT:  General Message     REASON FOR REQUEST: Request for call back      MESSAGE: The CCR attempted to warm transfer, but Care Team was assisting other patients.    The patient stated shewas returning a call.

## 2018-08-09 NOTE — Telephone Encounter (Signed)
lmtcb

## 2018-08-10 LAB — BASIC METABOLIC PANEL
Anion Gap: 11 (ref 4–12)
Calcium: 9.3 mg/dL (ref 8.9–10.2)
Carbon Dioxide, Total: 27 meq/L (ref 22–32)
Chloride: 99 meq/L (ref 98–108)
Creatinine: 0.59 mg/dL (ref 0.38–1.02)
GFR, Calc, African American: >60 mL/min/{1.73_m2} (ref >59)
GFR, Calc, European American: >60 mL/min/{1.73_m2} (ref >59)
Glucose: 194 mg/dL — ABNORMAL HIGH (ref 62–125)
Potassium: 4.2 meq/L (ref 3.6–5.2)
Sodium: 137 meq/L (ref 135–145)
Urea Nitrogen: 9 mg/dL (ref 8–21)

## 2018-08-10 LAB — THYROID STIMULATING HORMONE: Thyroid Stimulating Hormone: 1.008 u[IU]/mL (ref 0.400–5.000)

## 2018-08-10 NOTE — Telephone Encounter (Signed)
Per note returned call and left detailed message.    [address] [place], [place], [place] [phone]

## 2018-08-15 ENCOUNTER — Encounter (INDEPENDENT_AMBULATORY_CARE_PROVIDER_SITE_OTHER): Payer: Self-pay | Admitting: Physician Assistant

## 2018-08-15 ENCOUNTER — Ambulatory Visit (INDEPENDENT_AMBULATORY_CARE_PROVIDER_SITE_OTHER): Payer: BLUE CROSS/BLUE SHIELD | Admitting: Physician Assistant

## 2018-08-15 VITALS — BP 113/78 | HR 88 | Temp 97.7°F | Resp 16 | Ht 61.81 in | Wt 208.0 lb

## 2018-08-15 DIAGNOSIS — E119 Type 2 diabetes mellitus without complications: Secondary | ICD-10-CM

## 2018-08-15 DIAGNOSIS — Z6838 Body mass index (BMI) 38.0-38.9, adult: Secondary | ICD-10-CM

## 2018-08-15 DIAGNOSIS — Z135 Encounter for screening for eye and ear disorders: Secondary | ICD-10-CM

## 2018-08-15 NOTE — Progress Notes (Signed)
SUBJECTIVE:    Marilyn Neal is a 42 year old female who presents for management of diabetes.    Recent symptoms of diabetes: fatigue, polyuria (increased urination), visual disturbances  Current Diabetes Medications: glimeperide 4mg  twice daily. For the past 4 months she was taking this irregularly due to traveling for work and limited supply of medication. Started taking as prescribed last week.  Previous Medications tried:  Metformin, didn't tolerate GI effects  Taking Medication as prescribed?: YES for past week, not taking as prescribed prior 4 months.  Current blood sugar monitoring: not checking regularly  Current diet or eating plan: plenty of vegetables and fruits, beef, chicken, limits processed carbs but eats them on occasion. Cut out soda.  Current exercise: no regular exercise  Complications of diabetes: hyperglycemia and retinopathy      Diabetes Health Maintenance  Hemoglobin A1C: Hemoglobin A1C (%)   Date Value   08/09/2018 12.3 (H)      Glucose Targets: 90-130 mg/dL fasting and   <098<180 mg/dL postprandial   In Care Management: NO   RD/Diabetes Education within last 3 years: NO-   BP at Goal (<140/90 mmHg): YES   Eye Exam 1 year:  NO   Last Foot Exam Last order of FOOT EXAM W/MONOFILAMENT & TUNING FORK was found on 08/15/2018 from Office Visit on 08/15/2018      On a Statin (age over 3140): NO   Sleep Apnea:   No   Pneumococcal Vaccine Administration date: patient declines at this time         Hemoglobin A1C   Date Value Ref Range Status   08/09/2018 12.3 (H) 4.0 - 6.0 % Final     Creatinine   Date Value Ref Range Status   08/09/2018 0.59 0.38 - 1.02 mg/dL Final        ROS:  EYE: negative and Admits to: blurred vision  CARDIAC: negative  RESP: negative  INTEGUMENT: negative and no rashes nor lesions of concern  NEURO: migraine headaches and numbness or tingling of hands    There is no problem list on file for this patient.       OBJECTIVE:    BP 113/78    Pulse 88    Temp 97.7 F (36.5 C) (Temporal)     Resp 16    Ht 5' 1.81" (1.57 m)    Wt 208 lb (94.3 kg)    LMP 07/21/2018    SpO2 98%    BMI 38.28 kg/m    Wt Readings from Last 3 Encounters:   08/15/18 208 lb (94.3 kg)   08/09/18 209 lb (94.8 kg)      BP Readings from Last 3 Encounters:   08/15/18 113/78   08/09/18 122/80     Physical Exam   Constitutional: She is oriented to person, place, and time.   HENT:   Head: Normocephalic and atraumatic.   Cardiovascular: Normal rate, regular rhythm, normal heart sounds and intact distal pulses. Exam reveals no gallop and no friction rub.   No murmur heard.  Pulmonary/Chest: Effort normal and breath sounds normal. No respiratory distress. She has no wheezes. She has no rales. She exhibits no tenderness.   Abdominal: Soft. Bowel sounds are normal. She exhibits no distension and no mass. There is no tenderness.   Neurological: She is alert and oriented to person, place, and time. Gait normal.   Skin: Skin is warm and dry.   Psychiatric: Mood, memory, affect and judgment normal.     Office  Visit on 08/09/18   1. CBC   Result Value Ref Range    WBC 8.42 4.3 - 10.0 10*3/uL    RBC 4.69 3.80 - 5.00 10*6/uL    Hemoglobin 13.5 11.5 - 15.5 g/dL    Hematocrit 41 36 - 45 %    MCV 86 81 - 98 fL    MCH 28.8 27.3 - 33.6 pg    MCHC 33.3 32.2 - 36.5 g/dL    Platelet Count 098266 119150 - 400 10*3/uL    RDW-CV 12.3 11.6 - 14.4 %   2. Thyroid Stimulating Hormone   Result Value Ref Range    Thyroid Stimulating Hormone 1.008 0.400 - 5.000 u[IU]/mL   3. A1C RAPID, ONSITE   Result Value Ref Range    Hemoglobin A1C 12.3 (H) 4.0 - 6.0 %    Narrative    RESULTS VERIFIED BY REPEAT ANALYSIS     4. Basic Metabolic Panel   Result Value Ref Range    Sodium 137 135 - 145 meq/L    Potassium 4.2 3.6 - 5.2 meq/L    Chloride 99 98 - 108 meq/L    Carbon Dioxide, Total 27 22 - 32 meq/L    Anion Gap 11 4 - 12    Glucose 194 (H) 62 - 125 mg/dL    Urea Nitrogen 9 8 - 21 mg/dL    Creatinine 1.470.59 8.290.38 - 1.02 mg/dL    Calcium 9.3 8.9 - 56.210.2 mg/dL    GFR, Calc, European  American >60 >59 mL/min/[1.73_m2]    GFR, Calc, African American >60 >59 mL/min/[1.73_m2]    GFR, Information       Calculated GFR in mL/min/1.73 m2 by MDRD equation.  Inaccurate with changing renal function.  See http://[email or web address]/labweb/test/bclim/cGFR.html         ASSESSMENT/PLAN:  1. Diabetic retinopathy screening  - DIABETIC RETINOPATHY SCREENING; Future    2. Type 2 diabetes mellitus without complication, without long-term current use of insulin (HCC)  -low carbohydrate diet, eliminating processed carbohydrates  -start exercising, swimming would be great  - FOOT EXAM W/MONOFILAMENT & TUNING FORK- 100% sensate   - REFERRAL TO DIABETES EDUCATOR  - A1C RAPID, ONSITE; Future    Follow up in 2 months to recheck A1C    Patient Instructions   Thank you for coming in today. It was a pleasure seeing you.    Medications prescribed:none   Take glimepiride 4mg  twice daily      Referrals made: diabetes education, retinopathy screening (eye exam    Recommendations:  Keep eating a healthy diet. You're doing a good job of avoiding processed carbohydrates.     Make appointment for follow up:  2 month follow up to repeat A1c, and assess how well controlled the diabetes is. You may come back sooner if other issues arise.  The diabetes education class is very helpful. I recommend going to this.  Make appointment for eye exam.    I hope you're feeling better.

## 2018-08-15 NOTE — Progress Notes (Signed)
Patient roomed by Toni AmendSarbjit Orland Jarred(Sabby) D CMA  Accompanied by self .  Allergies , Medications and smoking hx verified and Vital taken for today's visit .    Reason for visit: Establish care   DM Follow Up       Cervical screening/PAP:Due - don't remember when was her last pap smear   Mammo: NA  Colon Screen: NA  Have you seen a specialist since your last visit: NO        HM Due:   Health Maintenance   Topic Date Due    Pneumococcal Vaccine: Pediatrics (0-5 years) and At-Risk Patients (6-64 years) (1 of 1 - PPSV23) 03/30/1980    Depression Screening (PHQ-2)  03/30/1986    HIV Screening  03/30/1989    Diabetes Foot Exam  03/30/1992    Diabetes Eye Exam  03/30/1992    Cervical Cancer Screening  03/31/1995    Influenza Vaccine (1) 09/27/2018    Diabetes A1c  02/09/2019    Tetanus Vaccine  12/29/2019              Future Appointments   Date Time Provider Department Center   08/15/2018  4:00 PM Fanta, Prentiss Bellsurtis Mitchell, PA-C [facility]

## 2018-08-15 NOTE — Patient Instructions (Signed)
Thank you for coming in today. It was a pleasure seeing you.    Medications prescribed:none   Take glimepiride 4mg  twice daily      Referrals made: diabetes education, retinopathy screening (eye exam    Recommendations:  Keep eating a healthy diet. You're doing a good job of avoiding processed carbohydrates.     Make appointment for follow up:  2 month follow up to repeat A1c, and assess how well controlled the diabetes is. You may come back sooner if other issues arise.  The diabetes education class is very helpful. I recommend going to this.  Make appointment for eye exam.    I hope you're feeling better.

## 2018-08-17 ENCOUNTER — Telehealth (INDEPENDENT_AMBULATORY_CARE_PROVIDER_SITE_OTHER): Payer: Self-pay | Admitting: Unknown Physician Specialty

## 2018-08-17 NOTE — Telephone Encounter (Signed)
On 08/15/18, Marilyn Hearing Fanta PA referred patient for diabetes education. I called and LVM that I am calling to f/u on Marilyn Neal' referral for diabetes education. Left my direct line and asked that she return my call.

## 2018-08-19 ENCOUNTER — Ambulatory Visit (INDEPENDENT_AMBULATORY_CARE_PROVIDER_SITE_OTHER): Payer: BLUE CROSS/BLUE SHIELD | Admitting: Family Practice

## 2018-08-19 VITALS — BP 118/80 | HR 92 | Temp 97.5°F | Resp 18 | Wt 208.0 lb

## 2018-08-19 DIAGNOSIS — E119 Type 2 diabetes mellitus without complications: Secondary | ICD-10-CM

## 2018-08-19 DIAGNOSIS — Z6838 Body mass index (BMI) 38.0-38.9, adult: Secondary | ICD-10-CM

## 2018-08-19 DIAGNOSIS — R110 Nausea: Secondary | ICD-10-CM

## 2018-08-19 LAB — PR U/A AUTO DIPSTICK ONLY, ONSITE
Bilirubin, Urine: NEGATIVE
Leukocytes: NEGATIVE
Nitrite, URN: NEGATIVE
Occult Blood, URN: NEGATIVE
Protein: NEGATIVE mg/dL
Specific Gravity, Urine: 1.015 (ref 1.005–1.030)
Urobilinogen, URN: 0.2 E.U./dL (ref 0.2–1.0)
pH, URN: 5.5 (ref 5.0–8.0)

## 2018-08-19 LAB — PR GLUCOSE, WHOLE BLOOD, ONSITE: Glucose: 266 mg/dL — ABNORMAL HIGH (ref 62–125)

## 2018-08-19 MED ORDER — GLIMEPIRIDE 1 MG OR TABS
ORAL_TABLET | ORAL | 0 refills | Status: DC
Start: 2018-08-19 — End: 2018-09-09

## 2018-08-19 NOTE — Progress Notes (Signed)
Patient Referred By: No ref. provider found  Patient's PCP: Charlyne Quale, PA-C     Subjective:  Patient is a 42 year old female, here to discuss Medical Problem (recently glimiperide doubled, fatigued weak tired, upset stomach, HA, loss of energy, +n-v, aches and chills and sweats. )    The following portions of the patient's history were reviewed with the patient and updated as appropriate: problem list, current medications, allergies and past medical history.    HPI  Marilyn Neal is a 42 year old female w/ type 2 DM not feeling well since increasing glimepiride to 4 mg BID on 08/15/18. Prior she was in [place] and not taking it regularly , taking it 4 mg a day on most days but not every day.   She feels tired, feet hurt, doesn't have any energy  Hot flashes  She is having nausea  No vomiting  No diarrhea  She is having abdominal pain  Has not been checking her  bg at home  Last dose was 2 pm today    Review of Systems   Constitutional: Positive for diaphoresis and fatigue. Negative for chills and fever.   Gastrointestinal: Positive for abdominal pain and nausea. Negative for vomiting.   Genitourinary: Negative.    All other systems reviewed and are negative.        Objective:  BP 118/80    Pulse 92    Temp 97.5 F (36.4 C) (Temporal)    Resp 18    Wt 208 lb (94.3 kg)    LMP 07/21/2018    SpO2 98%    BMI 38.28 kg/m     Physical Exam   Constitutional: She appears well-developed and well-nourished. No distress.   Eyes: No scleral icterus.   Cardiovascular: Normal rate, regular rhythm and normal heart sounds. Exam reveals no gallop and no friction rub.   No murmur heard.  Pulmonary/Chest: Effort normal and breath sounds normal. No respiratory distress. She has no wheezes. She has no rales.   Abdominal: Soft. She exhibits no distension and no mass. There is no tenderness. There is no rebound, no guarding and no CVA tenderness. No hernia.   Skin: She is not diaphoretic.   Nursing note and vitals  reviewed.    Results for orders placed or performed in visit on 08/19/18   GLUCOSE, WHOLE BLOOD, ONSITE   Result Value Ref Range    Glucose 266 (H) 62 - 125 mg/dL   U/A AUTO DIPSTICK ONLY, ONSITE   Result Value Ref Range    Color, Urine YELLOW     Clarity, URN CLEAR     Glucose, Urine 3+ (A) NEG mg/dL    Bilirubin, Urine NEGATIVE NEG    Ketones, URN TRACE (A) NEG mg/dL    Specific Gravity, Urine 1.015 1.005 - 1.030    Occult Blood, URN NEGATIVE NEG    pH, URN 5.5 5.0 - 8.0    Protein NEGATIVE NEG-TRACE mg/dL    Urobilinogen, URN 0.2 0.2 - 1.0 E.U./dL    Nitrite, URN NEGATIVE NEG    Leukocytes NEGATIVE NEG        Assessment and Plan:  1. Type 2 diabetes mellitus without complication, without long-term current use of insulin (HCC)  2. Nausea  Will increase her glimepiride gradually   Glimepiride 4 mg a day for a week, then 4 mg in am and  1 mg in the pm for a week, then 4 mg in am and 2 mg in the  pm for a week, then 4 mg in am and 3 mg in the pm for a week, then 4 mg in the am and pm for a week    See AVS for further patient instructions  RTC/Call if symptoms worsen or change in character or no improvement in 3-5 days. ER precautions reviewed.  Discussed medication dosage, usage, goals of therapy, and side effects.

## 2018-08-19 NOTE — Progress Notes (Signed)
Vitals taken by [employer], MAC, STR, XTR

## 2018-08-19 NOTE — Patient Instructions (Signed)
Dear Marilyn Neal,    Glimepiride 4 mg a day for a week, then 4 mg in the am and 1 mg in the pm for a week, then 4 mg in the am and 2 mg in the pm for a week, then 4 mg in the am and 3 mg in the pm for a week, then 4 mg twice a day    Marilyn MetroGil Dyami Umbach, MD

## 2018-08-30 NOTE — Telephone Encounter (Signed)
2nd attempt to reach pt. LVM offering DSME. Informed patient that I can see her tomorrow, then I am out of the clinic until 09/19/18.   Left my direct line and asked that she return my call.

## 2018-09-09 ENCOUNTER — Other Ambulatory Visit (INDEPENDENT_AMBULATORY_CARE_PROVIDER_SITE_OTHER): Payer: Self-pay | Admitting: Family Practice

## 2018-09-09 DIAGNOSIS — R110 Nausea: Secondary | ICD-10-CM

## 2018-09-09 MED ORDER — GLIMEPIRIDE 1 MG OR TABS
ORAL_TABLET | ORAL | 0 refills | Status: DC
Start: 2018-09-09 — End: 2019-07-26

## 2018-09-09 NOTE — Telephone Encounter (Signed)
Lyda JesterCurtis, are you willing to fill for pt? [facility] doesn't do refills and she has appt with you on 10/21. Thanks!

## 2018-09-09 NOTE — Telephone Encounter (Signed)
Order signed.  Thank you,  Imri Lor PA-C

## 2018-09-13 ENCOUNTER — Other Ambulatory Visit (INDEPENDENT_AMBULATORY_CARE_PROVIDER_SITE_OTHER): Payer: Self-pay | Admitting: Family Practice

## 2018-09-13 DIAGNOSIS — R110 Nausea: Secondary | ICD-10-CM

## 2018-09-14 NOTE — Telephone Encounter (Signed)
rx was filled by PCP Tawni Levyurtis Fanta, PA-C

## 2018-09-19 ENCOUNTER — Telehealth (INDEPENDENT_AMBULATORY_CARE_PROVIDER_SITE_OTHER): Payer: Self-pay | Admitting: Unknown Physician Specialty

## 2018-09-19 NOTE — Telephone Encounter (Signed)
LVM 08/17/18 & 08/30/18 offering DSME.     Called pt again today. Marilyn Neal states she cannot schedule an appt as she no longer has insurance.   She is hoping to have insurance in the next couple months.     She is taking Glimepiride 6 mg AM and 2 mg PM. I reviewed provider order. Pt voices understanding and will now take 4 mg AM and 3 mg PM. After 1 week, she will increase PM dose to 4 mg. End dose will be 4 mg BID with meals.    08/19/18 urgent care visit -   Dear Marilyn Neal,    Glimepiride 4 mg a day for a week, then 4 mg in the am and 1 mg in the pm for a week, then 4 mg in the am and 2 mg in the pm for a week, then 4 mg in the am and 3 mg in the pm for a week, then 4 mg twice a day    Marilyn MetroGil Mazurik, MD    She does not test BG. States she does not like needles. I explained importance of testing. States she needs supplies but cannot afford them. I asked that she come to the clinic so I can give her a glucose meter and test strips. She agreed to call me this week to schedule a time for pick-up.    I explained A1C and that it tells us that her average BG is around 300. Informed her that we would like her AM BG to be around 130 and post-prandial <180. She voices understanding.     Explained Glimepiride mechanism of action and why it needs to be taken with a meal.     Living in RV so does not have a mailing address.     Plan: schedule DSME when patient has insurance. She has my direct line for any questions. Added myself as RN on care team. Will call her if she does not pick-up meter in the next week.

## 2018-10-04 ENCOUNTER — Telehealth (INDEPENDENT_AMBULATORY_CARE_PROVIDER_SITE_OTHER): Payer: Self-pay | Admitting: Unknown Physician Specialty

## 2018-10-04 NOTE — Telephone Encounter (Signed)
F/u on call of 09/19/18. LVM asking Marilyn Neal to call me. Noted she is seeing PCP on 10/17/18. Asked if she would like to see for diabetes education that same day.

## 2018-10-17 ENCOUNTER — Encounter (INDEPENDENT_AMBULATORY_CARE_PROVIDER_SITE_OTHER): Payer: Self-pay | Admitting: Physician Assistant

## 2018-10-19 ENCOUNTER — Telehealth (INDEPENDENT_AMBULATORY_CARE_PROVIDER_SITE_OTHER): Payer: Self-pay | Admitting: Unknown Physician Specialty

## 2018-10-19 NOTE — Telephone Encounter (Signed)
LVM asking pt if she has obtained insurance as yet. In Sept, patient stated she did not have insurance so could not schedule appt with provider or RN. I offered glucose meter and test strips and instruction in how to use them. She did not want to schedule. Left meter and strips at front desk but she did not pick them up.     I have made several outreaches to patient. Removing myself as RN on care team.     She does have my direct line.

## 2018-10-31 ENCOUNTER — Other Ambulatory Visit (INDEPENDENT_AMBULATORY_CARE_PROVIDER_SITE_OTHER): Payer: Self-pay | Admitting: Physician Assistant

## 2018-10-31 DIAGNOSIS — E119 Type 2 diabetes mellitus without complications: Secondary | ICD-10-CM

## 2018-11-01 MED ORDER — GLIMEPIRIDE 4 MG OR TABS
4.0000 mg | ORAL_TABLET | Freq: Two times a day (BID) | ORAL | 1 refills | Status: DC
Start: 2018-11-01 — End: 2019-12-08

## 2018-11-01 NOTE — Telephone Encounter (Signed)
This medication is outside of the Refill Center's protocols. Please sign and close the encounter if you approve:    glimepiride   - plan at last visit on 08/15/18 was to return in 2 months   - patient missed appointment on 10/17/18 and has not rescheduled (per 10/19/18 TE patient did not have insurance)   - okay to refill without follow-up or labs?    If this medication is denied please have your staff inform the patient and schedule an appointment if necessary.

## 2018-11-01 NOTE — Telephone Encounter (Signed)
Order signed for glimepiride.  Thank you,  Tawni Levy PA-C

## 2018-11-19 ENCOUNTER — Other Ambulatory Visit: Payer: Self-pay

## 2018-12-07 ENCOUNTER — Telehealth (INDEPENDENT_AMBULATORY_CARE_PROVIDER_SITE_OTHER): Payer: Self-pay

## 2018-12-07 NOTE — Telephone Encounter (Signed)
HN spoke to pt to schedule dm follow up and was informed pt currently does not have insurance.

## 2019-01-12 ENCOUNTER — Telehealth (INDEPENDENT_AMBULATORY_CARE_PROVIDER_SITE_OTHER): Payer: Self-pay

## 2019-01-12 NOTE — Telephone Encounter (Signed)
Patient doesn't have insurance at this time so unfortunately she can't come in, when things get situated she will make an apt   Thank you  Byrd Hesselbach

## 2019-01-12 NOTE — Telephone Encounter (Signed)
RN routing to [facility] to schedule for diabetes follow-up appointment. Patient missed 07/2018 appointment with PCP, A1c was 12.3% on 08/09/2018.

## 2019-04-15 ENCOUNTER — Inpatient Hospital Stay: Payer: Self-pay

## 2019-07-24 ENCOUNTER — Other Ambulatory Visit (INDEPENDENT_AMBULATORY_CARE_PROVIDER_SITE_OTHER): Payer: Self-pay | Admitting: Physician Assistant

## 2019-07-24 DIAGNOSIS — E119 Type 2 diabetes mellitus without complications: Secondary | ICD-10-CM

## 2019-07-26 MED ORDER — GLIMEPIRIDE 4 MG OR TABS
ORAL_TABLET | ORAL | 0 refills | Status: DC
Start: 2019-07-26 — End: 2019-08-24

## 2019-07-26 NOTE — Telephone Encounter (Signed)
Patient is due for diabetes exam.  One refill authorized.  Please schedule follow up visit.

## 2019-07-26 NOTE — Telephone Encounter (Signed)
Patient declined appointment at this time, no insurance.  Patient will call when ready to schedule.

## 2019-08-23 ENCOUNTER — Other Ambulatory Visit (INDEPENDENT_AMBULATORY_CARE_PROVIDER_SITE_OTHER): Payer: Self-pay | Admitting: Physician Assistant

## 2019-08-23 DIAGNOSIS — E119 Type 2 diabetes mellitus without complications: Secondary | ICD-10-CM

## 2019-08-24 NOTE — Telephone Encounter (Signed)
The requested medication requires your authorization because it is outside of the Refill Authorization Centers protocols: Patient not seen within the last year.    Glimepiride   Last visit 08/15/18 & was to Return in about 2 months (around 10/15/2018).  Pt asked to make appointment 07/24/19 refill request but declined ( no insurance)    If you want to authorize it please indicate refills, sign the order and route to front desk for appointment   If this medication is denied please have your staff inform the patient and schedule an appointment if necessary.

## 2019-08-25 MED ORDER — GLIMEPIRIDE 4 MG OR TABS
4.0000 mg | ORAL_TABLET | Freq: Two times a day (BID) | ORAL | 0 refills | Status: DC
Start: 2019-08-25 — End: 2020-01-03

## 2019-08-25 NOTE — Telephone Encounter (Signed)
Phone call to pt , no answer . Left VM for him/her to return clinic call and schedule follow up appointment per provider.  CCR , please assist pt scheduling appt if he/she calls back . Thank you .

## 2019-08-25 NOTE — Telephone Encounter (Signed)
Please call Verdis Frederickson.  Medication filled for one month supply. Please schedule patient within the next month for diabetes follow up and lab work.   Future labs ordered.  Thank you,  Deirdre Evener PA-C

## 2019-08-28 NOTE — Telephone Encounter (Signed)
Call to pt - LMTCB

## 2019-09-01 ENCOUNTER — Encounter (INDEPENDENT_AMBULATORY_CARE_PROVIDER_SITE_OTHER): Payer: Self-pay | Admitting: Physician Assistant

## 2019-09-01 NOTE — Telephone Encounter (Signed)
3rd attempt - LM that Vicente Serene would like pt to come back for a f/u appt before her refill runs out.    Letter sent.

## 2019-09-01 NOTE — Telephone Encounter (Signed)
Patient returned the call     Patient is asking for the phone calls to please stop at this time she is on unemployment and has no medical insurance     I let patient know that we do have financial assistance forms that she is more then welcome to come and fill out we do not want her neglecting her care patient is a diabetic     Patient states that she is going to utilize [employer] in the area where she is staying at the moment.

## 2019-10-15 ENCOUNTER — Other Ambulatory Visit: Payer: Self-pay

## 2019-12-04 ENCOUNTER — Encounter (INDEPENDENT_AMBULATORY_CARE_PROVIDER_SITE_OTHER): Payer: No Typology Code available for payment source | Admitting: Physician Assistant

## 2019-12-04 NOTE — Progress Notes (Signed)
Telemed not appropriate, reschedule for in clinic visit.  Thank you,  Deirdre Evener PA-C    This encounter was opened in error

## 2019-12-08 ENCOUNTER — Ambulatory Visit (INDEPENDENT_AMBULATORY_CARE_PROVIDER_SITE_OTHER): Payer: No Typology Code available for payment source | Admitting: Physician Assistant

## 2019-12-08 ENCOUNTER — Encounter (INDEPENDENT_AMBULATORY_CARE_PROVIDER_SITE_OTHER): Payer: Self-pay | Admitting: Physician Assistant

## 2019-12-08 VITALS — BP 123/87 | HR 106 | Temp 97.3°F | Resp 20 | Ht 62.0 in | Wt 215.0 lb

## 2019-12-08 DIAGNOSIS — E119 Type 2 diabetes mellitus without complications: Secondary | ICD-10-CM

## 2019-12-08 DIAGNOSIS — Z0000 Encounter for general adult medical examination without abnormal findings: Secondary | ICD-10-CM

## 2019-12-08 LAB — CBC (HEMOGRAM)
Hematocrit: 43 % (ref 36–45)
Hemoglobin: 14.3 g/dL (ref 11.5–15.5)
MCH: 28.5 pg (ref 27.3–33.6)
MCHC: 33.3 g/dL (ref 32.2–36.5)
MCV: 86 fL (ref 81–98)
Platelet Count: 307 10*3/uL (ref 150–400)
RBC: 5.01 10*6/uL — ABNORMAL HIGH (ref 3.80–5.00)
RDW-CV: 12.3 % (ref 11.6–14.4)
WBC: 8.65 10*3/uL (ref 4.3–10.0)

## 2019-12-08 LAB — COMPREHENSIVE METABOLIC PANEL
ALT (GPT): 27 U/L (ref 7–33)
AST (GOT): 38 U/L (ref 9–38)
Albumin: 4.1 g/dL (ref 3.5–5.2)
Alkaline Phosphatase (Total): 92 U/L (ref 34–121)
Anion Gap: 16 — ABNORMAL HIGH (ref 4–12)
Bilirubin (Total): 0.9 mg/dL (ref 0.2–1.3)
Calcium: 9.1 mg/dL (ref 8.9–10.2)
Carbon Dioxide, Total: 24 meq/L (ref 22–32)
Chloride: 95 meq/L — ABNORMAL LOW (ref 98–108)
Creatinine: 0.64 mg/dL (ref 0.38–1.02)
Glucose: 394 mg/dL — ABNORMAL HIGH (ref 62–125)
Potassium: 3.8 meq/L (ref 3.6–5.2)
Protein (Total): 7.4 g/dL (ref 6.0–8.2)
Sodium: 135 meq/L (ref 135–145)
Urea Nitrogen: 6 mg/dL — ABNORMAL LOW (ref 8–21)
eGFR by CKD-EPI: >60 mL/min/{1.73_m2} (ref >59)

## 2019-12-08 LAB — LIPID PANEL
Cholesterol/HDL Ratio: 7.7
HDL Cholesterol: 34 mg/dL — ABNORMAL LOW (ref >39)
Non-HDL Cholesterol: 229 mg/dL — ABNORMAL HIGH (ref 0–159)
Total Cholesterol: 263 mg/dL — ABNORMAL HIGH (ref <200)
Triglyceride: 552 mg/dL — ABNORMAL HIGH (ref <150)

## 2019-12-08 LAB — ALBUMIN/CREATININE RATIO, RANDOM URINE
Albumin (Micro), URN: 1.89 mg/dL
Albumin/Creatinine Ratio, URN: 34 mg/g{creat} — ABNORMAL HIGH (ref <30)
Creatinine/Unit, URN: 56 mg/dL

## 2019-12-08 LAB — THYROID STIMULATING HORMONE: Thyroid Stimulating Hormone: 1.497 u[IU]/mL (ref 0.400–5.000)

## 2019-12-08 MED ORDER — BLOOD GLUCOSE TEST VI STRP
1.0000 | ORAL_STRIP | Freq: Two times a day (BID) | 4 refills | Status: DC
Start: 2019-12-08 — End: 2019-12-08

## 2019-12-08 MED ORDER — LANCETS THIN MISC
1.0000 | Freq: Two times a day (BID) | 4 refills | Status: AC
Start: 2019-12-08 — End: ?

## 2019-12-08 MED ORDER — LANCETS THIN MISC
1.0000 | Freq: Two times a day (BID) | 4 refills | Status: DC
Start: 2019-12-08 — End: 2019-12-08

## 2019-12-08 MED ORDER — GLIMEPIRIDE 4 MG OR TABS
4.0000 mg | ORAL_TABLET | Freq: Two times a day (BID) | ORAL | 1 refills | Status: AC
Start: 2019-12-08 — End: 2020-06-05

## 2019-12-08 MED ORDER — BLOOD GLUCOSE MONITORING SUPPL KIT
1.0000 | PACK | Freq: Two times a day (BID) | 0 refills | Status: DC
Start: 2019-12-08 — End: 2019-12-08

## 2019-12-08 MED ORDER — GLIMEPIRIDE 4 MG OR TABS
4.0000 mg | ORAL_TABLET | Freq: Two times a day (BID) | ORAL | 1 refills | Status: DC
Start: 2019-12-08 — End: 2019-12-08

## 2019-12-08 MED ORDER — SEMAGLUTIDE 7 MG OR TABS
7.0000 mg | ORAL_TABLET | Freq: Every morning | ORAL | 1 refills | Status: DC
Start: 2019-12-08 — End: 2020-01-03

## 2019-12-08 MED ORDER — BLOOD GLUCOSE MONITORING SUPPL KIT
1.0000 | PACK | Freq: Two times a day (BID) | 0 refills | Status: AC
Start: 2019-12-08 — End: ?

## 2019-12-08 MED ORDER — SEMAGLUTIDE 7 MG OR TABS
7.0000 mg | ORAL_TABLET | Freq: Every morning | ORAL | 1 refills | Status: DC
Start: 2019-12-08 — End: 2019-12-08

## 2019-12-08 MED ORDER — BLOOD GLUCOSE TEST VI STRP
1.0000 | ORAL_STRIP | Freq: Two times a day (BID) | 4 refills | Status: AC
Start: 2019-12-08 — End: ?

## 2019-12-08 NOTE — Progress Notes (Signed)
Pt roomed, medications, and allergies verified and updated by Jean Rosenthal, [place]     Reason for visit: Wellness ( )      Cervical screening/PAP:  Mammo:   Colon Screen:   Have you seen a specialist since your last visit:      If  Name and location and date.         Health Maintenance   Topic Date Due    Hepatitis C Screening  January 21, 1976    Pneumococcal Vaccine: Pediatrics (0-5 years) and At-Risk Patients (6-64 years) (1 of 1 - PPSV23) 03/30/1980    Depression Screening (PHQ-2)  03/30/1986    Diabetes Eye Exam  03/30/1992    DTaP, Tdap, and Td Vaccines (1 - Tdap) 03/30/1993    Hepatitis B Vaccine (1 of 3 - Risk 3-dose series) 03/30/1993    Cervical Cancer Screening  03/31/1995    Diabetes A1c  02/09/2019    Diabetes Foot Exam  08/16/2019    Diabetes Nephropathy: Kidney Disease Monitoring  08/20/2019    Influenza Vaccine (1) 09/28/2019    Lipid Disorders Screening  03/31/2019    HIV Screening  Completed    Hepatitis A Vaccine  Aged Out                 Future Appointments   Date Time Provider [place]   12/08/2019  9:40 AM Fanta, Hinda Lenis, PA-C [facility]

## 2019-12-08 NOTE — Progress Notes (Addendum)
Chief Complaint   Patient presents with    Wellness     dm follow up, discuss DM medication,        HPI  Marilyn Neal is a 43 year old female presenting in clinic today for:    WELLNESS    Current Diet: not enough vegetables, too many carbs  Exercise: not much  Sleep: bad  Energy: decreased  Alcohol: none  Smoking Hx: quit smoking smoking 12 years ago    Other Concerns:    Diabetes  Last A1C- 12.3 in 07/2019  Checking blood sugars-  no  Average blood sugars-  na  Diet-  Not enough vegetables, too many carbs  Exercise-  Not enough  Taking medications as prescribed-  yes  Tolerating medications-  well  Denies:  polyuria, polydipsia, fatigue, light headed, shaking, syncope, presyncope  She states that she refuses to go on insulin, she would like to start on semaglutide.    Review of Systems   Constitutional: Negative.  Negative for chills, diaphoresis, fever, malaise/fatigue and weight loss.   Eyes: Negative for blurred vision and double vision.   Respiratory: Negative for cough and shortness of breath.    Cardiovascular: Negative for chest pain, palpitations and leg swelling.   Gastrointestinal: Negative for blood in stool and melena.   Genitourinary: Negative for flank pain and hematuria.   Neurological: Negative for dizziness and headaches.   Endo/Heme/Allergies: Negative for polydipsia. Does not bruise/bleed easily.        Patient Active Problem List   Diagnosis    Type 2 diabetes mellitus without complication, without long-term current use of insulin      Outpatient Medications Prior to Visit   Medication Sig Dispense Refill    glimepiride 4 MG tablet Take 1 tablet (4 mg) by mouth 2 times a day. Patient must be seen in clinic before next refill. 60 tablet 0    glimepiride 4 MG tablet Take 1 tablet (4 mg) by mouth 2 times a day. 180 tablet 1     No facility-administered medications prior to visit.        Social History     Socioeconomic History    Marital status: Single     Spouse name: Not on file     Number of children: Not on file    Years of education: Not on file    Highest education level: Not on file   Occupational History    Not on file   Social Needs    Financial resource strain: Not on file    Food insecurity     Worry: Not on file     Inability: Not on file    Transportation needs     Medical: Not on file     Non-medical: Not on file   Tobacco Use    Smoking status: Never Smoker   Substance and Sexual Activity    Alcohol use: Yes     Drinks per session: 1 or 2     Binge frequency: Less than monthly    Drug use: Not on file    Sexual activity: Not on file   Lifestyle    Physical activity     Days per week: Not on file     Minutes per session: Not on file    Stress: Not on file   Relationships    Social connections     Talks on phone: Not on file     Gets together: Not on file  Attends religious service: Not on file     Active member of club or organization: Not on file     Attends meetings of clubs or organizations: Not on file     Relationship status: Not on file    Intimate partner violence     Fear of current or ex partner: Not on file     Emotionally abused: Not on file     Physically abused: Not on file     Forced sexual activity: Not on file   Other Topics Concern    Not on file   Social History Narrative    Not on file           OBJECTIVE  Vitals:    12/08/19 0928   Temp: 97.3 F (36.3 C)   Pulse: (!) 106   BP: 123/87   Resp: 20   SpO2: 96%   Height: '5\' 2"'$  (1.575 m)   Weight: 215 lb (97.5 kg)      Physical Exam   Constitutional: She is oriented to person, place, and time and well-developed, well-nourished, and in no distress. No distress.   HENT:   Head: Normocephalic and atraumatic.   Nose: Nose normal.   Mouth/Throat: Oropharynx is clear and moist. No oropharyngeal exudate.   Eyes: Pupils are equal, round, and reactive to light. Conjunctivae and EOM are normal. Right eye exhibits no discharge. Left eye exhibits no discharge. No scleral icterus.   Neck: Normal range of  motion. Neck supple. No tracheal deviation present. No thyromegaly present.   Cardiovascular: Normal rate, regular rhythm, normal heart sounds and intact distal pulses. Exam reveals no gallop and no friction rub.   No murmur heard.  Pulmonary/Chest: Effort normal and breath sounds normal. No respiratory distress. She has no wheezes. She has no rales. She exhibits no tenderness.   Abdominal: Soft. Bowel sounds are normal. She exhibits no distension and no mass. There is no hepatosplenomegaly. There is no abdominal tenderness. There is no CVA tenderness.   Musculoskeletal: Normal range of motion.         General: No edema.   Lymphadenopathy:     She has no cervical adenopathy.   Neurological: She is alert and oriented to person, place, and time. No cranial nerve deficit. Gait normal.   Diabetes foot exam: 100% sensate   Skin: Skin is warm and dry. She is not diaphoretic.   Psychiatric: Mood, memory, affect and judgment normal.   Nursing note and vitals reviewed.        ASSESSMENT/PLAN  1. Type 2 diabetes mellitus without complication, without long-term current use of insulin  - FOOT EXAM W/MONOFILAMENT & TUNING FORK  - REFERRAL TO EYE CARE  - Hemoglobin A1c  - REFERRAL TO ENDOCRINOLOGY  - URINE SCREEN, MICROALBUMINURIA  - semaglutide (Rybelsus) 7 MG tablet; Take 1 tablet (7 mg) by mouth every morning. Take on an empty stomach, 30 minutes before the first meal, drink, or other oral medications.  Dispense: 60 tablet; Refill: 1  - Lancets Thin miscellaneous; Use 1 each 2 times a day.  Dispense: 100 each; Refill: 4  - Glucose Blood (Blood Glucose Test) strip; Use 1 strip 2 times a day. Use to check blood sugar.  Dispense: 100 strip; Refill: 4  - glimepiride 4 MG tablet; Take 1 tablet (4 mg) by mouth 2 times a day.  Dispense: 180 tablet; Refill: 1  - Blood Glucose Monitoring Suppl kit; Use 1 kit 2 times a day. Use to check blood  sugar.  Dispense: 1 kit; Refill: 0    2. Preventative health care  - CBC  - Comprehensive  Metabolic Panel  - Lipid Panel  - Thyroid Stimulating Hormone    Discussed long term risk of uncontrolled diabetes such as: MI, CVA, blindness, infection, amputation, kidney failure  Importance of diet high in vegetables and low in carbohydrate discussed  Increase exercise  Discussed importance of checking blood sugars   Make appointment with endocrinologist  Schedule PAP with female provider in clinic

## 2019-12-11 ENCOUNTER — Telehealth (INDEPENDENT_AMBULATORY_CARE_PROVIDER_SITE_OTHER): Payer: Self-pay | Admitting: Physician Assistant

## 2019-12-11 LAB — HEMOGLOBIN A1C, HPLC: Hemoglobin A1C: 13.2 % — ABNORMAL HIGH (ref 4.0–6.0)

## 2019-12-11 NOTE — Telephone Encounter (Signed)
Pt unable to do telemed and unable to come in because she lives in [place].  Phone visit scheduled for 12/22.

## 2019-12-11 NOTE — Telephone Encounter (Signed)
Sorry, I did not mean to send this to the PA pool.    Please call Dailyn to schedule soonest available appointment to review labs. Telemed would be okay.    Thank you,  Deirdre Evener PA-C

## 2019-12-11 NOTE — Telephone Encounter (Signed)
Please call Crysta to schedule soonest available appointment to review labs. Telemed would be okay.  Thank you,  Deirdre Evener PA-C

## 2019-12-19 ENCOUNTER — Telehealth (INDEPENDENT_AMBULATORY_CARE_PROVIDER_SITE_OTHER): Payer: No Typology Code available for payment source | Admitting: Physician Assistant

## 2019-12-26 ENCOUNTER — Telehealth: Payer: No Typology Code available for payment source | Admitting: Physician Assistant

## 2019-12-26 ENCOUNTER — Encounter (INDEPENDENT_AMBULATORY_CARE_PROVIDER_SITE_OTHER): Payer: Self-pay | Admitting: Physician Assistant

## 2019-12-26 DIAGNOSIS — E1165 Type 2 diabetes mellitus with hyperglycemia: Secondary | ICD-10-CM

## 2019-12-26 NOTE — Progress Notes (Signed)
Active [facility]: YES    Health Maintenance Plan: Defer all outstanding HM per patient request.     No specialty comments available.    Health Maintenance   Topic Date Due    Hepatitis C Screening  1976/11/25    Pneumococcal Vaccine: Pediatrics (0-5 years) and At-Risk Patients (6-64 years) (1 of 1 - PPSV23) 03/30/1980    Diabetes Eye Exam  03/30/1992    DTaP, Tdap, and Td Vaccines (1 - Tdap) 03/30/1993    Hepatitis B Vaccine (1 of 3 - Risk 3-dose series) 03/30/1993    Cervical Cancer Screening  03/31/1995    Influenza Vaccine (1) 09/28/2019    Diabetes A1c  06/07/2020    Diabetes Nephropathy: Kidney Disease Monitoring  12/07/2020    Depression Screening (PHQ-2)  12/07/2020    Diabetes Foot Exam  12/07/2020    Lipid Disorders Screening  12/07/2024    HIV Screening  Completed    Hepatitis A Vaccine  Aged Out       Future Appointments   Date Time Provider [place]   12/26/2019 11:20 AM Fanta, Hinda Lenis, PA-C [facility]

## 2019-12-26 NOTE — Progress Notes (Signed)
Chief Complaint   Patient presents with    Test Results     follow up blood work 12/11    Medication Management     follow up Rybelsus; Rx pending prior auth       HPI  Marilyn Neal is a 43 year old female presenting in clinic today for:      This visit is being conducted over the telephone at the patients request: Yes  Patient gives verbal consent to proceed and knows there may be a copay/deductible: Yes    Time spent with patient/guardian on this telephone visit: 15 minutes    Given the importance of social distancing and other strategies recommended to reduce the risk of COVID-19 transmission, I am providing medical care to this patient via a telephone visit in place of an in person visit at the request of the patient.    Discussed poorly controlled diabetes and recent A1C of 13.2. She refuses to take insulin and check her blood sugars as she has an aversion to needles. She states that she has been working on cutting down on sugar intake. She denies  polyuria, polydipsia, fatigue, light headed, shaking, syncope, presyncope.      Review of Systems   Constitutional: Negative.  Negative for chills, diaphoresis, fever, malaise/fatigue and weight loss.   Eyes: Negative for blurred vision and double vision.   Respiratory: Negative for cough and shortness of breath.    Cardiovascular: Negative for chest pain, palpitations and leg swelling.   Gastrointestinal: Negative for blood in stool and melena.   Genitourinary: Negative for flank pain and hematuria.   Neurological: Negative for dizziness and headaches.   Endo/Heme/Allergies: Negative for polydipsia. Does not bruise/bleed easily.        Patient Active Problem List   Diagnosis    Type 2 diabetes mellitus without complication, without long-term current use of insulin      Outpatient Medications Prior to Visit   Medication Sig Dispense Refill    Blood Glucose Monitoring Suppl kit Use 1 kit 2 times a day. Use to check blood sugar. 1 kit 0    glimepiride  4 MG tablet Take 1 tablet (4 mg) by mouth 2 times a day. 180 tablet 1    glimepiride 4 MG tablet Take 1 tablet (4 mg) by mouth 2 times a day. Patient must be seen in clinic before next refill. 60 tablet 0    Glucose Blood (Blood Glucose Test) strip Use 1 strip 2 times a day. Use to check blood sugar. 100 strip 4    Lancets Thin miscellaneous Use 1 each 2 times a day. 100 each 4    semaglutide (Rybelsus) 7 MG tablet Take 1 tablet (7 mg) by mouth every morning. Take on an empty stomach, 30 minutes before the first meal, drink, or other oral medications. 60 tablet 1     No facility-administered medications prior to visit.        Social History     Socioeconomic History    Marital status: Single     Spouse name: Not on file    Number of children: Not on file    Years of education: Not on file    Highest education level: Not on file   Occupational History    Not on file   Social Needs    Financial resource strain: Not on file    Food insecurity     Worry: Not on file     Inability: Not on  file    Transportation needs     Medical: Not on file     Non-medical: Not on file   Tobacco Use    Smoking status: Never Smoker    Smokeless tobacco: Never Used   Substance and Sexual Activity    Alcohol use: Yes     Drinks per session: 1 or 2     Binge frequency: Less than monthly    Drug use: Yes     Types: Marijuana    Sexual activity: Not on file   Lifestyle    Physical activity     Days per week: Not on file     Minutes per session: Not on file    Stress: Not on file   Relationships    Social connections     Talks on phone: Not on file     Gets together: Not on file     Attends religious service: Not on file     Active member of club or organization: Not on file     Attends meetings of clubs or organizations: Not on file     Relationship status: Not on file    Intimate partner violence     Fear of current or ex partner: Not on file     Emotionally abused: Not on file     Physically abused: Not on file      Forced sexual activity: Not on file   Other Topics Concern    Not on file   Social History Narrative    Not on file           OBJECTIVE  There were no vitals filed for this visit.   Physical Exam  Over the phone, patient exhibits NONE of the following:  Change in cognition  Change in behavior  Unable to form full sentences  Short of breath/ Difficult breathing  Cough  Slurred speech        ASSESSMENT/PLAN    1. Type 2 diabetes mellitus with hyperglycemia, without long-term current use of insulin  - REFERRAL TO ENDOCRINOLOGY

## 2020-01-02 ENCOUNTER — Telehealth (INDEPENDENT_AMBULATORY_CARE_PROVIDER_SITE_OTHER): Payer: Self-pay | Admitting: Physician Assistant

## 2020-01-02 DIAGNOSIS — E119 Type 2 diabetes mellitus without complications: Secondary | ICD-10-CM

## 2020-01-02 NOTE — Telephone Encounter (Signed)
Received PA request for Semaglutide (Rybelsus) 7 MG tablet     Requesting Pharmacy: [facility]   Phone # [phone]   Fax # [phone]       Reason for PA: Non preferred/PA required       Insurance Name: [employer]   Phone # [phone]   [facility] #    Member ID # [id number]     To Provider: Dr. Felecia Shelling, the above medication is a non-formulary and requires a PA.  Please advise how we should answer the below question.  *When done, please route back to [facility] PRESCRIPTION PRIOR AUTHORIZATION POOL*           Alternatives  [email or web address]    Key: [id number] (saved)

## 2020-01-03 MED ORDER — DAPAGLIFLOZIN PROPANEDIOL 10 MG OR TABS
10.0000 mg | ORAL_TABLET | Freq: Every day | ORAL | 1 refills | Status: AC
Start: 2020-01-03 — End: ?

## 2020-01-03 NOTE — Telephone Encounter (Signed)
She requested to try this medication as she is declining to take insulin. I will put in for dapagliflozin which she will take 10mg  daily. Please call patient to make sure she is aware that insurance is declining to cover semaglutide and I have put in for dapagliflozin as a replacement medication.  Thank you,  PA-C

## 2020-01-03 NOTE — Telephone Encounter (Signed)
Called pt-l/m.

## 2020-01-05 NOTE — Telephone Encounter (Signed)
Called pt lmtcb

## 2020-01-06 NOTE — Telephone Encounter (Signed)
ecare message sent

## 2020-01-31 ENCOUNTER — Encounter (INDEPENDENT_AMBULATORY_CARE_PROVIDER_SITE_OTHER): Payer: Self-pay

## 2020-07-31 ENCOUNTER — Encounter (INDEPENDENT_AMBULATORY_CARE_PROVIDER_SITE_OTHER): Payer: Self-pay

## 2020-10-16 ENCOUNTER — Other Ambulatory Visit: Payer: Self-pay

## 2022-07-12 LAB — OTHER LAB ORDER
Hemoglobin A1C: 7.7 % — ABNORMAL HIGH (ref 4.3–6.0)
Mean Blood Glucose Estimate: 174 mg/dL

## 2023-08-08 LAB — OTHER LAB ORDER
Hemoglobin A1C: 7.9 % — ABNORMAL HIGH (ref <5.7)
Mean Blood Glucose Estimate: 180 mg/dL

## 2024-02-17 LAB — OTHER LAB ORDER
Hemoglobin A1C: 6.9 % — ABNORMAL HIGH (ref <5.7)
Mean Blood Glucose Estimate: 151 mg/dL

## 7364-03-28 DEATH — deceased
